# Patient Record
Sex: Male | Born: 1985 | Race: White | Hispanic: No | Marital: Married | Smoking: Never smoker
Health system: Southern US, Community
[De-identification: ages and names within clinical notes are randomized; demographics above are authoritative.]

## PROBLEM LIST (undated history)

## (undated) DIAGNOSIS — F32A Depression, unspecified: Secondary | ICD-10-CM

## (undated) DIAGNOSIS — G43909 Migraine, unspecified, not intractable, without status migrainosus: Secondary | ICD-10-CM

## (undated) DIAGNOSIS — M2550 Pain in unspecified joint: Secondary | ICD-10-CM

## (undated) DIAGNOSIS — G4733 Obstructive sleep apnea (adult) (pediatric): Secondary | ICD-10-CM

## (undated) DIAGNOSIS — R079 Chest pain, unspecified: Secondary | ICD-10-CM

## (undated) DIAGNOSIS — J189 Pneumonia, unspecified organism: Secondary | ICD-10-CM

## (undated) DIAGNOSIS — Z8711 Personal history of peptic ulcer disease: Secondary | ICD-10-CM

## (undated) DIAGNOSIS — G4730 Sleep apnea, unspecified: Secondary | ICD-10-CM

## (undated) DIAGNOSIS — L259 Unspecified contact dermatitis, unspecified cause: Secondary | ICD-10-CM

## (undated) DIAGNOSIS — R0602 Shortness of breath: Secondary | ICD-10-CM

## (undated) DIAGNOSIS — G8929 Other chronic pain: Secondary | ICD-10-CM

## (undated) DIAGNOSIS — K219 Gastro-esophageal reflux disease without esophagitis: Secondary | ICD-10-CM

## (undated) DIAGNOSIS — N289 Disorder of kidney and ureter, unspecified: Secondary | ICD-10-CM

## (undated) DIAGNOSIS — M549 Dorsalgia, unspecified: Secondary | ICD-10-CM

## (undated) DIAGNOSIS — R7303 Prediabetes: Secondary | ICD-10-CM

## (undated) DIAGNOSIS — K589 Irritable bowel syndrome without diarrhea: Secondary | ICD-10-CM

## (undated) DIAGNOSIS — Z87442 Personal history of urinary calculi: Secondary | ICD-10-CM

## (undated) HISTORY — DX: Depression, unspecified: F32.A

## (undated) HISTORY — DX: Irritable bowel syndrome, unspecified: K58.9

## (undated) HISTORY — DX: Personal history of peptic ulcer disease: Z87.11

## (undated) HISTORY — DX: Disorder of kidney and ureter, unspecified: N28.9

## (undated) HISTORY — DX: Unspecified contact dermatitis, unspecified cause: L25.9

## (undated) HISTORY — DX: Shortness of breath: R06.02

## (undated) HISTORY — DX: Other chronic pain: G89.29

## (undated) HISTORY — DX: Sleep apnea, unspecified: G47.30

## (undated) HISTORY — DX: Dorsalgia, unspecified: M54.9

## (undated) HISTORY — DX: Chest pain, unspecified: R07.9

## (undated) HISTORY — DX: Migraine, unspecified, not intractable, without status migrainosus: G43.909

## (undated) HISTORY — DX: Gastro-esophageal reflux disease without esophagitis: K21.9

## (undated) HISTORY — DX: Obstructive sleep apnea (adult) (pediatric): G47.33

## (undated) HISTORY — DX: Pain in unspecified joint: M25.50

---

## 2008-02-24 ENCOUNTER — Emergency Department (HOSPITAL_BASED_OUTPATIENT_CLINIC_OR_DEPARTMENT_OTHER): Admission: EM | Admit: 2008-02-24 | Discharge: 2008-02-24 | Payer: Self-pay | Admitting: Internal Medicine

## 2009-08-27 ENCOUNTER — Ambulatory Visit: Payer: Self-pay | Admitting: Family Medicine

## 2009-08-27 DIAGNOSIS — K219 Gastro-esophageal reflux disease without esophagitis: Secondary | ICD-10-CM | POA: Insufficient documentation

## 2009-08-27 DIAGNOSIS — S61209A Unspecified open wound of unspecified finger without damage to nail, initial encounter: Secondary | ICD-10-CM | POA: Insufficient documentation

## 2009-08-27 DIAGNOSIS — G4700 Insomnia, unspecified: Secondary | ICD-10-CM | POA: Insufficient documentation

## 2009-08-27 DIAGNOSIS — Z7721 Contact with and (suspected) exposure to potentially hazardous body fluids: Secondary | ICD-10-CM | POA: Insufficient documentation

## 2010-02-23 ENCOUNTER — Ambulatory Visit: Payer: Self-pay | Admitting: Family Medicine

## 2010-03-24 ENCOUNTER — Ambulatory Visit
Admission: RE | Admit: 2010-03-24 | Discharge: 2010-03-24 | Payer: Self-pay | Source: Home / Self Care | Attending: Family Medicine | Admitting: Family Medicine

## 2010-03-24 ENCOUNTER — Encounter: Payer: Self-pay | Admitting: Family Medicine

## 2010-03-27 LAB — CONVERTED CEMR LAB: HIV: NONREACTIVE

## 2010-04-14 NOTE — Assessment & Plan Note (Signed)
Summary: NEW PT EST // RS   Vital Signs:  Patient profile:   25 year old male Height:      71.75 inches Weight:      268 pounds BMI:     36.73 Temp:     98.7 degrees F oral Pulse rate:   108 / minute Pulse rhythm:   regular Resp:     12 per minute BP sitting:   108 / 80  (left arm) Cuff size:   regular  Vitals Entered By: Sid Falcon LPN (August 27, 2009 11:19 AM)  Nutrition Counseling: Patient's BMI is greater than 25 and therefore counseled on weight management options. CC: New to establish   History of Present Illness: New patient to establish care. Past medical history reviewed. History of GERD symptoms controlled with diet modification and over-the-counter medication. History of frequent tension headaches. No significant chronic medical problems. No prior surgeries. Takes no medications. No known drug allergies.  Patient has 2 issues to discuss today. 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 6 days ago he was searching someone and reached into their pocket and cut middle finger with a razor blade. No history of known exposure to any body fluids. Patient went to emergency room and no testing done. Went to the nurse with police department and the testing done. Prior history of hepatitis B vaccination.  Other issues intermittent problems with insomnia. Every 3 weeks changes shifts. Difficulty falling asleep. Minimal caffeine use. Tylenol PM without improvement. No regular alcohol use. No signif daytime somnolence.  Preventive Screening-Counseling & Management  Alcohol-Tobacco     Smoking Status: never  Caffeine-Diet-Exercise     Does Patient Exercise: yes  Allergies (verified): No Known Drug Allergies  Past History:  Family History: Last updated: 08/27/2009 Family History of Anxiety Emotional illness, mother Diabetes type ll, father Heart disease, hypertension both grandfathers  Social History: Last updated: 08/27/2009 Occupation:  Engineer, agricultural Married Never Smoked Alcohol use-yes Regular exercise-yes  Risk Factors: Exercise: yes (08/27/2009)  Risk Factors: Smoking Status: never (08/27/2009)  Past Medical History: Chicken pox Frequent headaches GERD HIV testing PMH-FH-SH reviewed for relevance  Family History: Family History of Anxiety Emotional illness, mother Diabetes type ll, father Heart disease, hypertension both grandfathers  Social History: Occupation:  Producer, television/film/video Married Never Smoked Alcohol use-yes Regular exercise-yes Smoking Status:  never Occupation:  employed Does Patient Exercise:  yes  Review of Systems  The patient denies anorexia, fever, weight loss, weight gain, chest pain, syncope, dyspnea on exertion, peripheral edema, prolonged cough, headaches, hemoptysis, abdominal pain, melena, hematochezia, severe indigestion/heartburn, and enlarged lymph nodes.    Physical Exam  General:  Well-developed,well-nourished,in no acute distress; alert,appropriate and cooperative throughout examination Ears:  External ear exam shows no significant lesions or deformities.  Otoscopic examination reveals clear canals, tympanic membranes are intact bilaterally without bulging, retraction, inflammation or discharge. Hearing is grossly normal bilaterally. Mouth:  Oral mucosa and oropharynx without lesions or exudates.  Teeth in good repair. Neck:  No deformities, masses, or tenderness noted. Lungs:  Normal respiratory effort, chest expands symmetrically. Lungs are clear to auscultation, no crackles or wheezes. Heart:  Normal rate and regular rhythm. S1 and S2 normal without gallop, murmur, click, rub or other extra sounds. Extremities:  left middle finger reveals very small superficial laceration which is healing well. No erythema or swelling. Skin:  no rashes.   Cervical Nodes:  No lymphadenopathy noted Psych:  normally interactive, good eye contact, not anxious  appearing, and not  depressed appearing.     Impression & Recommendations:  Problem # 1:  INSOMNIA (ICD-780.52) Assessment New Sleep hygiene discussed. Handout given. Short-term p.r.n. use of Ambien His updated medication list for this problem includes:    Zolpidem Tartrate 10 Mg Tabs (Zolpidem tartrate) ..... One by mouth q hs as needed insomnia  Problem # 2:  LACERATION OF FINGER (ICD-883.0)  suspect very low risk of exposure to body fluids but history of razor blade use unknown. Obtain hepatitis C, HIV, and confirm hepatitis B vaccination.  Will need repeat studies in 6 months. Overall this appears to be fairly low risk type exposure.  Orders: T-HIV Antibody  (Reflex) [id number]) T-Hepatitis C Antibody ([id number]) T-Hepatitis B Surface Antibody ([id number]) Venipuncture (95188)  Problem # 3:  PERSONAL HX CONTACT & EXPOSUR HAZARDOUS BODY FLD (ICD-V15.85)  Orders: T-HIV Antibody  (Reflex) ([id number]) T-Hepatitis C Antibody ([id number]) T-Hepatitis B Surface Antibody ([id number]) Venipuncture (62376)  Problem # 4:  GERD (ICD-530.81)  Complete Medication List: 1)  Zolpidem Tartrate 10 Mg Tabs (Zolpidem tartrate) .... One by mouth q hs as needed insomnia  Patient Instructions: 1)  Please schedule a follow-up appointment in 6 months .  2)  minimize caffeine use. 3)  Minimize alcohol use prior to bedtime Prescriptions: ZOLPIDEM TARTRATE 10 MG TABS (ZOLPIDEM TARTRATE) one by mouth q hs as needed insomnia  #30 x 3   Entered and Authorized by:   Evelena Peat MD   Signed by:   Evelena Peat MD on 08/27/2009   Method used:   Print then Give to Patient   RxID:   [id number]   Preventive Care Screening  Last Tetanus Booster:    Date:  03/15/2006    Results:  Historical

## 2010-04-16 NOTE — Assessment & Plan Note (Signed)
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  Accidental cut to finger 6 months ago with razor blade when reaching in pocket. HIV and Hep C screens neg.  Hep B immune.  Here for f/u labs. No symptoms.  No fever, appetite changes or any other constitutional.  Chronic intermittent insomnia.  Swing shifts with work changing every 3 weeks. Ambien helps, though inconsistently.  Allergies (verified): No Known Drug Allergies  Past History:  Past Medical History: Last updated: 08/27/2009 Chicken pox Frequent headaches GERD HIV testing  Physical Exam  General:  Well-developed,well-nourished,in no acute distress; alert,appropriate and cooperative throughout examination Ears:  External ear exam shows no significant lesions or deformities.  Otoscopic examination reveals clear canals, tympanic membranes are intact bilaterally without bulging, retraction, inflammation or discharge. Hearing is grossly normal bilaterally. Mouth:  Oral mucosa and oropharynx without lesions or exudates.  Teeth in good repair. Neck:  No deformities, masses, or tenderness noted. Lungs:  Normal respiratory effort, chest expands symmetrically. Lungs are clear to auscultation, no crackles or wheezes. Heart:  Normal rate and regular rhythm. S1 and S2 normal without gallop, murmur, click, rub or other extra sounds.   Impression & Recommendations:  Problem # 1:  PERSONAL HX CONTACT & EXPOSUR HAZARDOUS BODY FLD (ICD-V15.85)  Orders: T-Hepatitis C Antibody ([id number]) T-HIV Antibody  (Reflex) ([id number]) Specimen Handling (02725) Venipuncture (36644)  Problem # 2:  INSOMNIA (ICD-780.52)  His updated medication list for this problem includes:  Zolpidem Tartrate 10 Mg Tabs (Zolpidem tartrate) ..... One by mouth q hs as needed insomnia  Complete Medication List: 1)  Zolpidem Tartrate 10 Mg Tabs (Zolpidem tartrate) .... One by mouth q hs as needed insomnia Prescriptions: ZOLPIDEM TARTRATE 10 MG TABS (ZOLPIDEM TARTRATE) one by mouth q hs as needed insomnia  #30 x 3   Entered and Authorized by:   Evelena Peat MD   Signed by:   Evelena Peat MD on 03/24/2010   Method used:   Print then Give to Patient   RxID:   [id number]    Orders Added: 1)  T-Hepatitis C Antibody [[id number]] 2)  T-HIV Antibody  (Reflex) [[id number]] 3)  Specimen Handling [99000] 4)  Venipuncture [09323] 5)  Est. Patient Level III [55732]

## 2010-07-29 ENCOUNTER — Other Ambulatory Visit: Payer: Self-pay | Admitting: *Deleted

## 2010-07-29 MED ORDER — ZOLPIDEM TARTRATE 10 MG PO TABS: 10.0000 mg | ORAL_TABLET | Freq: Every evening | ORAL | Status: DC | PRN

## 2010-07-29 NOTE — Telephone Encounter (Signed)
VM from pt requesting refill of Ambien, sig may take one at [facility] as needed for insomnia Last filled at [facility] 03/24/10 #30 with 3 refills

## 2010-07-29 NOTE — Telephone Encounter (Signed)
Pt informed on VM

## 2010-07-29 NOTE — Telephone Encounter (Signed)
May refill once. 

## 2010-09-22 ENCOUNTER — Other Ambulatory Visit: Payer: Self-pay | Admitting: *Deleted

## 2010-09-22 MED ORDER — ZOLPIDEM TARTRATE 10 MG PO TABS: 10.0000 mg | ORAL_TABLET | Freq: Every evening | ORAL | Status: DC | PRN

## 2010-09-22 NOTE — Telephone Encounter (Signed)
Ok to refill once

## 2010-09-22 NOTE — Telephone Encounter (Signed)
Faxed refill request for Zolpidem 10 mg tab, one at [facility] prn, last filled at [facility] 03-24-10, #30 with 3 refills

## 2012-07-24 ENCOUNTER — Ambulatory Visit (INDEPENDENT_AMBULATORY_CARE_PROVIDER_SITE_OTHER): Payer: Self-pay | Admitting: Family Medicine

## 2012-07-24 ENCOUNTER — Encounter: Payer: Self-pay | Admitting: Family Medicine

## 2012-07-24 VITALS — BP 120/84 | Temp 99.5°F | Wt 291.0 lb

## 2012-07-24 DIAGNOSIS — K219 Gastro-esophageal reflux disease without esophagitis: Secondary | ICD-10-CM

## 2012-07-24 DIAGNOSIS — G43909 Migraine, unspecified, not intractable, without status migrainosus: Secondary | ICD-10-CM | POA: Insufficient documentation

## 2012-07-24 NOTE — Progress Notes (Signed)
  Subjective:    Patient ID: Brian Lam, male    DOB: December 05, 1985, 27 y.o.   MRN: [id number]  HPI Seen for the following issues  GERD symptoms intermittently about every couple weeks.  Symptoms tend to be worse at night when he lies down. Sometimes wakes at night with reflux and substernal burning Sometimes lays down with full stomach.  No regular alcohol use. Drinks about 4-6 cups of coffee per day. No recent appetite or weight change. No melena. No hematemesis. No regular use of nonsteroidals  Patient has some intermittent headaches. He describes 2 different types of headache. He has some diffuse bifrontal headaches which are dull with decreased attention and Tylenol helps He has a different type of headache which is every 3 months which is intense throbbing with photophobia and nausea. These headaches are relieved with sleep. He is concerned about migraines. Mother has had history of migraine headaches. The headaches are sometimes improved with higher doses of nonsteroidals but not fully relieved. They frequently last 24 hours.  No past medical history on file. No past surgical history on file.  reports that he has never smoked. He does not have any smokeless tobacco history on file. His alcohol and drug histories are not on file. family history is not on file. No Known Allergies    Review of Systems  Constitutional: Negative for fatigue.  HENT: Negative for trouble swallowing.   Eyes: Negative for visual disturbance.  Respiratory: Negative for cough, chest tightness and shortness of breath.   Cardiovascular: Negative for chest pain, palpitations and leg swelling.  Gastrointestinal: Negative for nausea, vomiting and abdominal pain.  Neurological: Positive for headaches. Negative for dizziness, syncope, weakness and light-headedness.       Objective:   Physical Exam  Constitutional: He is oriented to person, place, and time. He appears well-developed and well-nourished.   Eyes: Pupils are equal, round, and reactive to light.  Neck: Neck supple. No thyromegaly present.  Cardiovascular: Normal rate and regular rhythm.   Pulmonary/Chest: Effort normal and breath sounds normal. No respiratory distress. He has no wheezes. He has no rales.  Abdominal: Soft. Bowel sounds are normal. He exhibits no distension. There is no tenderness. There is no rebound.  Lymphadenopathy:    He has no cervical adenopathy.  Neurological: He is alert and oriented to person, place, and time. No cranial nerve deficit.  Psychiatric: He has a normal mood and affect. His behavior is normal.          Assessment & Plan:  #1 GERD. Lifestyle modification discussed. Trial of some weight loss. Elevate head of bed 6-8 inches. Avoid eating within 3 hours of bedtime. Try over-the-counter Pepcid, Zantac, or omeprazole and be in touch if this is not helping #2 migraine headaches. Discussed possible triggers. Samples of Relpax 40 mg one at onset of headache and may repeat one in 2 hours as needed. Maximum of 2 in 24 hours. He will call back if these are helping

## 2012-07-24 NOTE — Patient Instructions (Addendum)
Gastroesophageal Reflux Disease, Adult Gastroesophageal reflux disease (GERD) happens when acid from your stomach flows up into the esophagus. When acid comes in contact with the esophagus, the acid causes soreness (inflammation) in the esophagus. Over time, GERD may create small holes (ulcers) in the lining of the esophagus. CAUSES   Increased body weight. This puts pressure on the stomach, making acid rise from the stomach into the esophagus.  Smoking. This increases acid production in the stomach.  Drinking alcohol. This causes decreased pressure in the lower esophageal sphincter (valve or ring of muscle between the esophagus and stomach), allowing acid from the stomach into the esophagus.  Late evening meals and a full stomach. This increases pressure and acid production in the stomach.  A malformed lower esophageal sphincter. Sometimes, no cause is found. SYMPTOMS   Burning pain in the lower part of the mid-chest behind the breastbone and in the mid-stomach area. This may occur twice a week or more often.  Trouble swallowing.  Sore throat.  Dry cough.  Asthma-like symptoms including chest tightness, shortness of breath, or wheezing. DIAGNOSIS  Your caregiver may be able to diagnose GERD based on your symptoms. In some cases, X-rays and other tests may be done to check for complications or to check the condition of your stomach and esophagus. TREATMENT  Your caregiver may recommend over-the-counter or prescription medicines to help decrease acid production. Ask your caregiver before starting or adding any new medicines.  HOME CARE INSTRUCTIONS   Change the factors that you can control. Ask your caregiver for guidance concerning weight loss, quitting smoking, and alcohol consumption.  Avoid foods and drinks that make your symptoms worse, such as:  Caffeine or alcoholic drinks.  Chocolate.  Peppermint or mint flavorings.  Garlic and onions.  Spicy foods.  Citrus fruits,  such as oranges, lemons, or limes.  Tomato-based foods such as sauce, chili, salsa, and pizza.  Fried and fatty foods.  Avoid lying down for the 3 hours prior to your bedtime or prior to taking a nap.  Eat small, frequent meals instead of large meals.  Wear loose-fitting clothing. Do not wear anything tight around your waist that causes pressure on your stomach.  Raise the head of your bed 6 to 8 inches with wood blocks to help you sleep. Extra pillows will not help.  Only take over-the-counter or prescription medicines for pain, discomfort, or fever as directed by your caregiver.  Do not take aspirin, ibuprofen, or other nonsteroidal anti-inflammatory drugs (NSAIDs). SEEK IMMEDIATE MEDICAL CARE IF:   You have pain in your arms, neck, jaw, teeth, or back.  Your pain increases or changes in intensity or duration.  You develop nausea, vomiting, or sweating (diaphoresis).  You develop shortness of breath, or you faint.  Your vomit is green, yellow, black, or looks like coffee grounds or blood.  Your stool is red, bloody, or black. These symptoms could be signs of other problems, such as heart disease, gastric bleeding, or esophageal bleeding. MAKE SURE YOU:   Understand these instructions.  Will watch your condition.  Will get help right away if you are not doing well or get worse. Document Released: 12/09/2004 Document Revised: 05/24/2011 Document Reviewed: 09/18/2010 [facility] Patient Information 2013 [place], [place].  Elevate head of be 6-8 inches Avoid eating within 2-3 hours of bedtime. Migraine Headache A migraine headache is an intense, throbbing pain on one or both sides of your head. A migraine can last for 30 minutes to several hours. CAUSES  The  exact cause of a migraine headache is not always known. However, a migraine may be caused when nerves in the brain become irritated and release chemicals that cause inflammation. This causes pain. SYMPTOMS  Pain on one  or both sides of your head.  Pulsating or throbbing pain.  Severe pain that prevents daily activities.  Pain that is aggravated by any physical activity.  Nausea, vomiting, or both.  Dizziness.  Pain with exposure to bright lights, loud noises, or activity.  General sensitivity to bright lights, loud noises, or smells. Before you get a migraine, you may get warning signs that a migraine is coming (aura). An aura may include:  Seeing flashing lights.  Seeing bright spots, halos, or zig-zag lines.  Having tunnel vision or blurred vision.  Having feelings of numbness or tingling.  Having trouble talking.  Having muscle weakness. MIGRAINE TRIGGERS  Alcohol.  Smoking.  Stress.  Menstruation.  Aged cheeses.  Foods or drinks that contain nitrates, glutamate, aspartame, or tyramine.  Lack of sleep.  Chocolate.  Caffeine.  Hunger.  Physical exertion.  Fatigue.  Medicines used to treat chest pain (nitroglycerine), birth control pills, estrogen, and some blood pressure medicines. DIAGNOSIS  A migraine headache is often diagnosed based on:  Symptoms.  Physical examination.  A CT scan or MRI of your head. TREATMENT Medicines may be given for pain and nausea. Medicines can also be given to help prevent recurrent migraines.  HOME CARE INSTRUCTIONS  Only take over-the-counter or prescription medicines for pain or discomfort as directed by your caregiver. The use of long-term narcotics is not recommended.  Lie down in a dark, quiet room when you have a migraine.  Keep a journal to find out what may trigger your migraine headaches. For example, write down:  What you eat and drink.  How much sleep you get.  Any change to your diet or medicines.  Limit alcohol consumption.  Quit smoking if you smoke.  Get 7 to 9 hours of sleep, or as recommended by your caregiver.  Limit stress.  Keep lights dim if bright lights bother you and make your migraines  worse. SEEK IMMEDIATE MEDICAL CARE IF:   Your migraine becomes severe.  You have a fever.  You have a stiff neck.  You have vision loss.  You have muscular weakness or loss of muscle control.  You start losing your balance or have trouble walking.  You feel faint or pass out.  You have severe symptoms that are different from your first symptoms. MAKE SURE YOU:   Understand these instructions.  Will watch your condition.  Will get help right away if you are not doing well or get worse. Document Released: 03/01/2005 Document Revised: 05/24/2011 Document Reviewed: 02/19/2011 [facility] Patient Information 2013 [place], [place].  Relpax 40 mg at onset of migraine and may repeat in 2 hours as needed (max of 2 in 24 hours)

## 2013-06-25 ENCOUNTER — Ambulatory Visit (INDEPENDENT_AMBULATORY_CARE_PROVIDER_SITE_OTHER): Payer: Managed Care, Other (non HMO) | Admitting: Family Medicine

## 2013-06-25 ENCOUNTER — Encounter: Payer: Self-pay | Admitting: Family Medicine

## 2013-06-25 VITALS — BP 124/72 | HR 110 | Wt 259.0 lb

## 2013-06-25 DIAGNOSIS — M546 Pain in thoracic spine: Secondary | ICD-10-CM

## 2013-06-25 MED ORDER — METHOCARBAMOL 500 MG PO TABS: 500.0000 mg | ORAL_TABLET | Freq: Three times a day (TID) | ORAL | Status: DC | PRN

## 2013-06-25 NOTE — Progress Notes (Signed)
Pre visit review using our clinic review tool, if applicable. No additional management support is needed unless otherwise documented below in the visit note. 

## 2013-06-25 NOTE — Patient Instructions (Signed)
Back Pain, Adult Low back pain is very common. About 1 in 5 people have back pain.The cause of low back pain is rarely dangerous. The pain often gets better over time.About half of people with a sudden onset of back pain feel better in just 2 weeks. About 8 in 10 people feel better by 6 weeks.  CAUSES Some common causes of back pain include:  Strain of the muscles or ligaments supporting the spine.  Wear and tear (degeneration) of the spinal discs.  Arthritis.  Direct injury to the back. DIAGNOSIS Most of the time, the direct cause of low back pain is not known.However, back pain can be treated effectively even when the exact cause of the pain is unknown.Answering your caregiver's questions about your overall health and symptoms is one of the most accurate ways to make sure the cause of your pain is not dangerous. If your caregiver needs more information, he or she may order lab work or imaging tests (X-rays or MRIs).However, even if imaging tests show changes in your back, this usually does not require surgery. HOME CARE INSTRUCTIONS For many people, back pain returns.Since low back pain is rarely dangerous, it is often a condition that people can learn to manageon their own.   Remain active. It is stressful on the back to sit or stand in one place. Do not sit, drive, or stand in one place for more than 30 minutes at a time. Take short walks on level surfaces as soon as pain allows.Try to increase the length of time you walk each day.  Do not stay in bed.Resting more than 1 or 2 days can delay your recovery.  Do not avoid exercise or work.Your body is made to move.It is not dangerous to be active, even though your back may hurt.Your back will likely heal faster if you return to being active before your pain is gone.  Pay attention to your body when you bend and lift. Many people have less discomfortwhen lifting if they bend their knees, keep the load close to their bodies,and  avoid twisting. Often, the most comfortable positions are those that put less stress on your recovering back.  Find a comfortable position to sleep. Use a firm mattress and lie on your side with your knees slightly bent. If you lie on your back, put a pillow under your knees.  Only take over-the-counter or prescription medicines as directed by your caregiver. Over-the-counter medicines to reduce pain and inflammation are often the most helpful.Your caregiver may prescribe muscle relaxant drugs.These medicines help dull your pain so you can more quickly return to your normal activities and healthy exercise.  Put ice on the injured area.  Put ice in a plastic bag.  Place a towel between your skin and the bag.  Leave the ice on for 15-20 minutes, 03-04 times a day for the first 2 to 3 days. After that, ice and heat may be alternated to reduce pain and spasms.  Ask your caregiver about trying back exercises and gentle massage. This may be of some benefit.  Avoid feeling anxious or stressed.Stress increases muscle tension and can worsen back pain.It is important to recognize when you are anxious or stressed and learn ways to manage it.Exercise is a great option. SEEK MEDICAL CARE IF:  You have pain that is not relieved with rest or medicine.  You have pain that does not improve in 1 week.  You have new symptoms.  You are generally not feeling well. SEEK   IMMEDIATE MEDICAL CARE IF:   You have pain that radiates from your back into your legs.  You develop new bowel or bladder control problems.  You have unusual weakness or numbness in your arms or legs.  You develop nausea or vomiting.  You develop abdominal pain.  You feel faint. Document Released: 03/01/2005 Document Revised: 08/31/2011 Document Reviewed: 07/20/2010 [employer] Patient Information 2014 [employer].  

## 2013-06-25 NOTE — Progress Notes (Signed)
Subjective:    Patient ID: Brian Lam, male    DOB: 1985/05/06, 28 y.o.   MRN: [id number]  HPI Comments: Patient is a 28 year-old male who presents complaining of back pain. This began 6 days ago, described as a constant dull pain that can become sharp at times. The pain is accompanied by stiffness in the morning and at night before bed. He has tried NSAIDs and heat with minimal relief. He has had back pain in the past but it has never been this severe nor lasted this long. 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 Within the past 3 months he began exercising but does not remember specific injury that occurred. He has attempted and successfully lost 30 pounds. Patient endorses recent submandibular and submental lympadenopathy but denies dizziness, lightheadedness, chest pain, palpitations, shortness of breath, cough, nausea, vomiting, diarrhea, constipation, dysuria, loss of control of bladder or bowel movements, and saddle anesthesia. .   Back Pain This is a recurrent problem. The current episode started in the past 7 days. The problem has been gradually improving since onset. The pain is present in the thoracic spine. The quality of the pain is described as aching and stabbing. The pain does not radiate. The pain is at a severity of 8/10. The pain is severe. The pain is worse during the night. The symptoms are aggravated by lying down and sitting. Stiffness is present at night and in the morning. Associated symptoms include weight loss. Pertinent negatives include no abdominal pain, bladder incontinence, bowel incontinence, chest pain, fever, headaches, leg pain, numbness, tingling or weakness. He has tried NSAIDs and heat for the symptoms. The treatment provided mild relief.    No past medical history on file.  Review of Systems  Constitutional: Positive for weight loss and activity change. Negative for fever and unexpected weight change.  Eyes:  Negative for visual disturbance.  Respiratory: Negative for cough, chest tightness and shortness of breath.   Cardiovascular: Negative for chest pain and palpitations.  Gastrointestinal: Negative for nausea, vomiting, abdominal pain, diarrhea, constipation, blood in stool and bowel incontinence.  Endocrine: Negative for polyuria.  Genitourinary: Negative for bladder incontinence and difficulty urinating.  Musculoskeletal: Positive for back pain.  Neurological: Negative for dizziness, tingling, weakness, light-headedness, numbness and headaches.       Objective:   Physical Exam  Eyes: Conjunctivae and EOM are normal. Pupils are equal, round, and reactive to light.  Neck: Normal range of motion. No spinous process tenderness present. No mass present.  Musculoskeletal:       Right hip: He exhibits normal range of motion, normal strength and no tenderness.       Left hip: He exhibits normal range of motion, normal strength and no tenderness.       Cervical back: He exhibits normal range of motion, no tenderness, no bony tenderness, no deformity and no pain.       Thoracic back: He exhibits tenderness and pain. He exhibits normal range of motion, no bony tenderness, no swelling, no deformity and no spasm.       Lumbar back: He exhibits normal range of motion, no tenderness, no bony tenderness, no swelling, no deformity and no pain.  Lymphadenopathy:       Head (right side): No submental, no submandibular, no tonsillar, no preauricular and no posterior auricular adenopathy present.       Head (left side): No submental, no submandibular, no tonsillar, no preauricular  and no posterior auricular adenopathy present.    He has no cervical adenopathy.       Right: No supraclavicular adenopathy present.       Left: No supraclavicular adenopathy present.  Neurological: He is alert. He has normal strength. No sensory deficit. Gait normal.  Reflex Scores:      Patellar reflexes are 2+ on the right side  and 2+ on the left side.         Assessment & Plan:  Suspicous for muscular back pain . No imaging or physical therapy due to 6 day duration; advised to return in several weeks if symptoms persist.  Heavy lifitng discouraged. Recommened heat prior to exercise. Prescribed Robaxin 500mg  q 8 hours prn; advised not to take it during the day or work because of potential sedation side effect. Aleve can be used to control pain while working.   GrenadaBrittany Safiyah Cisney, PA-S  Agree with above.  Suspect muscular.  No worrisome red flags.  Consider PT if no better in 2-3 weeks.  Evelena PeatBruce Burchette, MD

## 2014-05-29 ENCOUNTER — Encounter: Payer: Managed Care, Other (non HMO) | Admitting: Family Medicine

## 2014-07-23 ENCOUNTER — Other Ambulatory Visit: Payer: Managed Care, Other (non HMO)

## 2014-07-26 ENCOUNTER — Encounter: Payer: Self-pay | Admitting: Family Medicine

## 2014-07-26 ENCOUNTER — Ambulatory Visit (INDEPENDENT_AMBULATORY_CARE_PROVIDER_SITE_OTHER): Payer: Managed Care, Other (non HMO) | Admitting: Family Medicine

## 2014-07-26 VITALS — BP 110/70 | HR 100 | Temp 98.6°F | Ht 71.0 in | Wt 299.0 lb

## 2014-07-26 DIAGNOSIS — R21 Rash and other nonspecific skin eruption: Secondary | ICD-10-CM

## 2014-07-26 DIAGNOSIS — K219 Gastro-esophageal reflux disease without esophagitis: Secondary | ICD-10-CM

## 2014-07-26 MED ORDER — PANTOPRAZOLE SODIUM 40 MG PO TBEC: 40.0000 mg | DELAYED_RELEASE_TABLET | Freq: Every day | ORAL | Status: DC

## 2014-07-26 MED ORDER — DESOXIMETASONE 0.25 % EX CREA: 1.0000 "application " | TOPICAL_CREAM | Freq: Two times a day (BID) | CUTANEOUS | Status: DC

## 2014-07-26 NOTE — Progress Notes (Signed)
Pre visit review using our clinic review tool, if applicable. No additional management support is needed unless otherwise documented below in the visit note. 

## 2014-07-26 NOTE — Progress Notes (Signed)
   Subjective:    Patient ID: Brian Lam, Brian Lam    DOB: February 29, 1986, 29 y.o.   MRN: [id number]  HPI Patient initially was scheduled for physical. However, he has some acute issues he would rather address instead.  Rash involving right hand. Present for several months. He initially recalls a small cut on his right index finger and subsequently developed some pruritic small vesicles on the sides of his fingers and subsequent scaly rash. This only involves the right hand and mostly lateral aspects of the digits. No pustules  Patient has long history of reflux symptoms. He has recently gained some weight back. He elevate head of bed and tries to avoid eating before bedtime. He has almost daily substernal burning. Exacerbated by certain foods. Still drinks about 4-6 cups of coffee per day. No dysphagia. He has reduced alcohol intake. Nonsmoker.  No past medical history on file. No past surgical history on file.  reports that he has never smoked. He does not have any smokeless tobacco history on file. His alcohol and drug histories are not on file. family history is not on file. No Known Allergies    Review of Systems  Constitutional: Negative for appetite change and unexpected weight change.  HENT: Negative for trouble swallowing.   Respiratory: Negative for shortness of breath.   Cardiovascular: Negative for chest pain.  Gastrointestinal: Negative for nausea, vomiting and diarrhea.  Skin: Positive for rash.       Objective:   Physical Exam  Constitutional: He appears well-developed and well-nourished.  Cardiovascular: Normal rate and regular rhythm.   Pulmonary/Chest: Effort normal and breath sounds normal. No respiratory distress. He has no wheezes. He has no rales.  Abdominal: Soft. Bowel sounds are normal. He exhibits no distension and no mass. There is no tenderness. There is no rebound and no guarding.  Skin: Rash noted.  Patient has erythematous rash involving lateral aspects  of the right first second third and fourth digits. No pustules. He has scaly rash which is nontender          Assessment & Plan:  #1 skin rash involving right hand. Differential is dyshidrotic eczema versus less likely fungal process. KOH obtained. Start Topicort 0.25% cream twice daily and reassess 3 weeks #2 GERD. Lifestyle modification discussed. Lose some weight. Continue to eat small more frequent meals. Reduce caffeine gradually. Start Protonix 40 mg once daily and reassess at follow-up.  May supplement with Pepcid or Zantac

## 2014-07-26 NOTE — Patient Instructions (Signed)

## 2014-07-27 LAB — KOH PREP: RESULT - KOH: NONE SEEN

## 2014-08-16 ENCOUNTER — Encounter: Payer: Self-pay | Admitting: Family Medicine

## 2014-08-16 ENCOUNTER — Ambulatory Visit (INDEPENDENT_AMBULATORY_CARE_PROVIDER_SITE_OTHER): Payer: Managed Care, Other (non HMO) | Admitting: Family Medicine

## 2014-08-16 VITALS — BP 120/80 | HR 110 | Temp 98.2°F | Wt 293.0 lb

## 2014-08-16 DIAGNOSIS — L301 Dyshidrosis [pompholyx]: Secondary | ICD-10-CM

## 2014-08-16 DIAGNOSIS — K219 Gastro-esophageal reflux disease without esophagitis: Secondary | ICD-10-CM

## 2014-08-16 NOTE — Progress Notes (Signed)
Pre visit review using our clinic review tool, if applicable. No additional management support is needed unless otherwise documented below in the visit note. 

## 2014-08-16 NOTE — Patient Instructions (Signed)
Consider taking Protonix daily for at least another month and then consider reduce to every other day for one month and then discontinue if no breakthrough symptoms.

## 2014-08-16 NOTE — Progress Notes (Signed)
   Subjective:    Patient ID: Brian Lam, male    DOB: [birth year], 29 y.o.   MRN: [id number]  HPI  Patient seen for follow-up  Recent GERD. Becoming also daily symptoms for several months. We started Protonix and has had virtual total resolution of symptoms. He noticed improvement after one day of medication. He is also scaled back caffeine use. No recent appetite or weight changes. No dysphagia  Skin rash involving hands. KOH negative for fungus. Improved with Topicort cream. Still has occasional itching  No past medical history on file. No past surgical history on file.  reports that he has never smoked. He does not have any smokeless tobacco history on file. His alcohol and drug histories are not on file. family history is not on file. No Known Allergies   Review of Systems  Constitutional: Negative for appetite change and unexpected weight change.  HENT: Negative for trouble swallowing.   Respiratory: Negative for shortness of breath.   Cardiovascular: Negative for chest pain.       Objective:   Physical Exam  Constitutional: He appears well-developed and well-nourished.  Cardiovascular: Normal rate and regular rhythm.   Pulmonary/Chest: Effort normal and breath sounds normal. No respiratory distress. He has no wheezes. He has no rales.  Skin:  Skin rashes much improved on hands. He still has some mild dryness and minimal erythema but much less scaling          Assessment & Plan:  #1 GERD. Improved. Continue protonix 40 mg daily for another month and then try reducing every other day for a month and if symptoms stable at that point try to discontinue. Continue lifestyle modification and try to lose some weight #2 dyshidrotic eczema. Improved. Do not use Topicort more than 2 weeks continuously. Consider daily moisturizer

## 2014-11-25 ENCOUNTER — Ambulatory Visit (INDEPENDENT_AMBULATORY_CARE_PROVIDER_SITE_OTHER): Payer: Managed Care, Other (non HMO) | Admitting: Family Medicine

## 2014-11-25 ENCOUNTER — Telehealth: Payer: Self-pay | Admitting: Family Medicine

## 2014-11-25 ENCOUNTER — Encounter: Payer: Self-pay | Admitting: Family Medicine

## 2014-11-25 VITALS — BP 126/78 | HR 86 | Temp 98.1°F | Wt 298.0 lb

## 2014-11-25 DIAGNOSIS — R21 Rash and other nonspecific skin eruption: Secondary | ICD-10-CM

## 2014-11-25 DIAGNOSIS — R197 Diarrhea, unspecified: Secondary | ICD-10-CM | POA: Diagnosis not present

## 2014-11-25 NOTE — Telephone Encounter (Signed)
Pt was seen 11/25/14

## 2014-11-25 NOTE — Progress Notes (Signed)
   Subjective:    Patient ID: Brian Lam    DOB: November 02, 1985, 29 y.o.   MRN: [id number]  HPI  Patient is seen for possible "spider bite "right left calf region. He was out in the woods a lot last Thursday. He has a couple small reddish spots that were pruritic on the posterior calf area. No pustules. No fevers or chills. He had some nonspecific malaise of the past couple days. He has had some diffuse abdominal cramps and had some non-bloody mild diarrhea earlier today. No fevers or chills. No recent antibiotics. No recent travels. No recent tick bites. No arthralgias. No other skin rash.  No past medical history on file. No past surgical history on file.  reports that he has never smoked. He does not have any smokeless tobacco history on file. His alcohol and drug histories are not on file. family history is not on file. No Known Allergies   Review of Systems  Constitutional: Positive for fatigue. Negative for fever and chills.  Respiratory: Negative for cough and shortness of breath.   Cardiovascular: Negative for chest pain.  Gastrointestinal: Positive for diarrhea. Negative for nausea, vomiting and abdominal pain.  Genitourinary: Negative for dysuria.  Musculoskeletal: Negative for arthralgias.  Skin: Positive for rash.  Hematological: Negative for adenopathy.       Objective:   Physical Exam  Constitutional: He appears well-developed and well-nourished.  Cardiovascular: Regular rhythm.   Pulmonary/Chest: Effort normal and breath sounds normal. No respiratory distress. He has no wheezes. He has no rales.  Abdominal: Soft. He exhibits no mass. There is no tenderness. There is no rebound and no guarding.  Skin: Rash noted.  He has a couple of small macular to slightly raised erythematous patches on his right posterior calf-1 cm- and smaller left posterior calf. No pustules. Does have slightly punctate center          Assessment & Plan:  #1 skin rash legs. Suspect  some type of bite. No evidence to suggest abscess and no pustules. Suspect mild allergic reaction. Over-the-counter topical steroid and consider an histamine and observe #2 mild diarrhea. Suspect viral process. Appears to be well-hydrated. Symptoms are minimal. Consider Imodium if diarrhea progresses

## 2014-11-25 NOTE — Patient Instructions (Signed)

## 2014-11-25 NOTE — Progress Notes (Signed)
Pre visit review using our clinic review tool, if applicable. No additional management support is needed unless otherwise documented below in the visit note. 

## 2014-11-25 NOTE — Telephone Encounter (Signed)
[facility] - Client TELEPHONE ADVICE RECORD [facility] Patient Name: Brian Lam DOB: 1985-10-03 Initial Comment Caller states had a spider bite on Thursday; now having fatigue and abd pain Nurse Assessment Nurse: Roderic Ovens, RN, Amy Date/Time Lamount Cohen Time): 11/25/2014 9:12:37 AM Confirm and document reason for call. If symptomatic, describe symptoms. ---THURSDAY MORNING SPIDER BITES X 2. HE IS NOT HAVING DISCOMFORT FROM THE BITES, SLIGHTLY ITCHY. THAT EVENING AND AFTERWARDS HE IS HAVING FATIGUE. HE THOUGHT HE WAS BUSY AND NOT HAVING TIME FOR REST. HE IS ALSO HAVING SOME ABD PAIN. LAST NIGHT ABD PAIN AND SOME DIARRHEA AS WELL. HE DOES NOT KNOW WHAT KIND OF SPIDERS THEY WERE. HE DID FEEL PAIN LIKE A BURNING SENSATION AFTER ONE SPIDERS. THE AREAS ARE RED, 1.5 INCH WIDE AND THE OTHER ONE IS VERY SMALL. THERE IS A SMALL MARK IN THE MIDDLE AND SCABBED OVER AND THERE WAS SOME PUS COMING OUT. HOT TO TOUCH, NO RED STREAKS FROM THEM. NO FEVER. Has the patient traveled out of the country within the last 30 days? ---Not Applicable Does the patient require triage? ---Yes Related visit to physician within the last 2 weeks? ---No Does the PT have any chronic conditions? (i.e. diabetes, asthma, etc.) ---Yes List chronic conditions. ---ACID REFLUX Did the patient indicate they were pregnant? ---No INCORRECT INFORMATION PUT IN AT THE BEGINNING OF RECORD. Guidelines Guideline Title Affirmed Question Affirmed Notes Spider Bite - [place] [1] SEVERE bite pain AND [2] not improved after 2 hours of pain medicine Final Disposition User See Physician within 4 Hours (or PCP triage) Roderic Ovens, RN, Amy Referrals REFERRED TO PCP OFFICE Disagree/Comply: Comply

## 2015-05-04 ENCOUNTER — Other Ambulatory Visit: Payer: Self-pay | Admitting: Family Medicine

## 2015-06-01 ENCOUNTER — Other Ambulatory Visit: Payer: Self-pay | Admitting: Family Medicine

## 2015-07-02 ENCOUNTER — Encounter: Payer: Self-pay | Admitting: Family Medicine

## 2015-07-02 ENCOUNTER — Ambulatory Visit (INDEPENDENT_AMBULATORY_CARE_PROVIDER_SITE_OTHER): Payer: Managed Care, Other (non HMO) | Admitting: Family Medicine

## 2015-07-02 VITALS — BP 112/82 | HR 100 | Temp 99.0°F | Ht 71.0 in | Wt 301.3 lb

## 2015-07-02 DIAGNOSIS — K219 Gastro-esophageal reflux disease without esophagitis: Secondary | ICD-10-CM

## 2015-07-02 DIAGNOSIS — R1310 Dysphagia, unspecified: Secondary | ICD-10-CM

## 2015-07-02 NOTE — Patient Instructions (Signed)
Food Choices for Gastroesophageal Reflux Disease, Adult When you have gastroesophageal reflux disease (GERD), the foods you eat and your eating habits are very important. Choosing the right foods can help ease the discomfort of GERD. WHAT GENERAL GUIDELINES DO I NEED TO FOLLOW?  Choose fruits, vegetables, whole grains, low-fat dairy products, and low-fat meat, fish, and poultry.  Limit fats such as oils, salad dressings, butter, nuts, and avocado.  Keep a food diary to identify foods that cause symptoms.  Avoid foods that cause reflux. These may be different for different people.  Eat frequent small meals instead of three large meals each day.  Eat your meals slowly, in a relaxed setting.  Limit fried foods.  Cook foods using methods other than frying.  Avoid drinking alcohol.  Avoid drinking large amounts of liquids with your meals.  Avoid bending over or lying down until 2-3 hours after eating. WHAT FOODS ARE NOT RECOMMENDED? The following are some foods and drinks that may worsen your symptoms: Vegetables Tomatoes. Tomato juice. Tomato and spaghetti sauce. Chili peppers. Onion and garlic. Horseradish. Fruits Oranges, grapefruit, and lemon (fruit and juice). Meats High-fat meats, fish, and poultry. This includes hot dogs, ribs, ham, sausage, salami, and bacon. Dairy Whole milk and chocolate milk. Sour cream. Cream. Butter. Ice cream. Cream cheese.  Beverages Coffee and tea, with or without caffeine. Carbonated beverages or energy drinks. Condiments Hot sauce. Barbecue sauce.  Sweets/Desserts Chocolate and cocoa. Donuts. Peppermint and spearmint. Fats and Oils High-fat foods, including [place] fries and potato chips. Other Vinegar. Strong spices, such as black pepper, white pepper, red pepper, cayenne, curry powder, cloves, ginger, and chili powder. The items listed above may not be a complete list of foods and beverages to avoid. Contact your dietitian for more  information.   This information is not intended to replace advice given to you by your health care provider. Make sure you discuss any questions you have with your health care provider.   Document Released: 03/01/2005 Document Revised: 03/22/2014 Document Reviewed: 01/03/2013 Elsevier Interactive Patient Education 2016 [employer].  Elevate head of bed about 6 inches Avoid eating within 3 hours of bedtime Consider adding OTC Zantac or Pepcid to your Protonix We will set up GI referral.

## 2015-07-02 NOTE — Progress Notes (Signed)
Pre visit review using our clinic review tool, if applicable. No additional management support is needed unless otherwise documented below in the visit note. 

## 2015-07-02 NOTE — Progress Notes (Signed)
   Subjective:    Patient ID: Brian HatchDaniel Lam, male    DOB: 1985/08/19, 30 y.o.   MRN: [id number]  HPI Patient has long history of GERD. He takes Protonix 40 mg daily. Has had more persistent breakthrough GERD symptoms recently with regular use of Protonix. Occasional mild dysphagia.  He has frequent substernal burning that radiates all the way to the sternal notch. Sometimes wakes up with symptoms. Night symptoms are very frequent. Denies any appetite or weight changes. Has recently tried to reduce his caffeine intake. Occasionally supplements with liquid antacid with some relief.  Nonsmoker. Rare alcohol use. Has elevated head of bed about 3 inches. No recent exertional chest pains. Denies any hoarseness or chronic cough  No past medical history on file. No past surgical history on file.  reports that he has never smoked. He does not have any smokeless tobacco history on file. His alcohol and drug histories are not on file. family history is not on file. No Known Allergies    Review of Systems  Constitutional: Negative for appetite change and unexpected weight change.  HENT: Negative for sore throat and voice change.   Respiratory: Negative for cough, shortness of breath and wheezing.   Cardiovascular: Negative for chest pain.  Gastrointestinal: Negative for nausea, vomiting and abdominal pain.  Hematological: Negative for adenopathy.       Objective:   Physical Exam  Constitutional: He appears well-developed and well-nourished.  Cardiovascular: Normal rate and regular rhythm.   Pulmonary/Chest: Effort normal and breath sounds normal. No respiratory distress. He has no wheezes. He has no rales.  Abdominal: Soft. Bowel sounds are normal. He exhibits no distension and no mass. There is no tenderness. There is no rebound and no guarding.  Musculoskeletal: He exhibits no edema.          Assessment & Plan:  Persistent GERD symptoms on PPI. We discussed dietary modification.  Continue gradual caffeine reduction. Discussed other possible dietary factors. Try to lose some weight. Supplement with Zantac or Pepcid. Set up GI referral given worsening of symptoms on PPI with Protonix.  He describes occasional dysphagia but no worrisome appetite or weight changes.

## 2015-07-05 ENCOUNTER — Other Ambulatory Visit: Payer: Self-pay | Admitting: Family Medicine

## 2015-07-21 ENCOUNTER — Ambulatory Visit (INDEPENDENT_AMBULATORY_CARE_PROVIDER_SITE_OTHER): Payer: Managed Care, Other (non HMO) | Admitting: Family Medicine

## 2015-07-21 ENCOUNTER — Ambulatory Visit (INDEPENDENT_AMBULATORY_CARE_PROVIDER_SITE_OTHER)
Admission: RE | Admit: 2015-07-21 | Discharge: 2015-07-21 | Disposition: A | Discharge status: Home / Self Care | Payer: Managed Care, Other (non HMO) | Source: Ambulatory Visit | Attending: Family Medicine | Admitting: Family Medicine

## 2015-07-21 VITALS — BP 118/90 | HR 94 | Temp 97.9°F | Ht 71.0 in | Wt 292.8 lb

## 2015-07-21 DIAGNOSIS — R0781 Pleurodynia: Secondary | ICD-10-CM

## 2015-07-21 NOTE — Progress Notes (Signed)
   Subjective:    Patient ID: Brian Lam, male    DOB: [birth year], 30 y.o.   MRN: [id number]  HPI  Patient seen with left anterior rib cage pain.  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 When he hit the ground he felt a sharp pain left anterior rib cage. Some pain with deep breathing. No visible bruising. He's had some pain left anterior rib cage around 6 to eighth ribs since then.   Last weekend he developed acute fever of 102 also some vomiting and body aches and headache. The symptoms of gradually improved. He has had no fever today whatsoever. No further vomiting. No diarrhea.   GERD symptoms somewhat improved after adding Zantac to his Protonix regimen  No past medical history on file. No past surgical history on file.  reports that he has never smoked. He does not have any smokeless tobacco history on file. His alcohol and drug histories are not on file. family history is not on file. No Known Allergies    Review of Systems  Constitutional: Negative for fever and chills.  Respiratory: Positive for cough. Negative for shortness of breath.   Cardiovascular: Negative for chest pain and palpitations.  Gastrointestinal: Negative for vomiting.       Objective:   Physical Exam  Constitutional: He appears well-developed and well-nourished.  Cardiovascular: Normal rate and regular rhythm.   Pulmonary/Chest: Effort normal and breath sounds normal. No respiratory distress. He has no wheezes. He has no rales. He exhibits no tenderness.  No visible chest wall bruising. He has some tenderness over the left seventh and eighth rib region          Assessment & Plan:   Left anterior rib cage pain following fall. Rule out fracture. We discussed pros and cons of x-rays and he would like to proceed to help define. 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  Brian CoveyBruce W My Madariaga MD [facility]

## 2015-07-21 NOTE — Progress Notes (Signed)
Pre visit review using our clinic review tool, if applicable. No additional management support is needed unless otherwise documented below in the visit note. 

## 2015-08-03 ENCOUNTER — Other Ambulatory Visit: Payer: Self-pay | Admitting: Family Medicine

## 2016-01-26 ENCOUNTER — Other Ambulatory Visit: Payer: Self-pay | Admitting: Family Medicine

## 2016-04-16 ENCOUNTER — Encounter: Payer: Self-pay | Admitting: Family Medicine

## 2016-04-16 ENCOUNTER — Ambulatory Visit (INDEPENDENT_AMBULATORY_CARE_PROVIDER_SITE_OTHER): Payer: Managed Care, Other (non HMO) | Admitting: Family Medicine

## 2016-04-16 VITALS — BP 118/90 | HR 130 | Temp 98.6°F | Ht 71.0 in | Wt 312.0 lb

## 2016-04-16 DIAGNOSIS — L301 Dyshidrosis [pompholyx]: Secondary | ICD-10-CM | POA: Diagnosis not present

## 2016-04-16 MED ORDER — DESOXIMETASONE 0.25 % EX CREA: 1.0000 "application " | TOPICAL_CREAM | Freq: Two times a day (BID) | CUTANEOUS | 2 refills | Status: DC

## 2016-04-16 NOTE — Progress Notes (Signed)
Subjective:     Patient ID: Brian Lam, male   DOB: 11-Feb-1986, 31 y.o.   MRN: [id number]  HPI Patient's seen for recurrent hand rash. He's had similar process in the past and has seen dermatologist. We had previously performed scraping which was negative for KOH. He states he was treated once for a "staph infection "with antibiotics but is not sure if this made much difference. He had scraping per dermatology which grew out some type of staph. Rash tends to recur and start with pruritic vesicles on the fingers that rupture and become more scaly. Moderate pruritus. He's not aware of any contact allergies. He's tried multiple types of moisturizers without much improvement. He has pending follow-up with dermatology at the end of this month.  History reviewed. No pertinent past medical history. History reviewed. No pertinent surgical history.  reports that he has never smoked. He has never used smokeless tobacco. He reports that he does not drink alcohol or use drugs. family history is not on file. No Known Allergies   Review of Systems  Constitutional: Negative for chills and fever.  Skin: Positive for rash.       Objective:   Physical Exam  Constitutional: He appears well-developed and well-nourished.  Cardiovascular: Normal rate and regular rhythm.   Skin: Rash noted.  Patient's has rash involving the right index and left middle finger. He has a few small vesicles but mostly dry scaly rash especially involving the left middle finger. No pustules.       Assessment:     Recurrent scaly skin rash involving the hands (fingers). Doubt contact allergy. Suspect dyshidrotic eczema    Plan:     -Trial of Topicort 0.25% cream twice daily -Keep hands out of water's much as possible -Keep dermatology appointment if symptoms not improving  Kristian CoveyBruce W Chequita Mofield MD [facility]

## 2016-04-16 NOTE — Patient Instructions (Signed)
  Try to keep hands out of water as much as possible May use the steroid cream up to two weeks continuous twice daily.

## 2016-04-16 NOTE — Progress Notes (Signed)
Pre visit review using our clinic review tool, if applicable. No additional management support is needed unless otherwise documented below in the visit note. 

## 2016-05-23 ENCOUNTER — Other Ambulatory Visit: Payer: Self-pay | Admitting: Family Medicine

## 2016-07-16 ENCOUNTER — Encounter: Payer: Self-pay | Admitting: Family Medicine

## 2016-07-19 MED ORDER — ELETRIPTAN HYDROBROMIDE 40 MG PO TABS: 40.0000 mg | ORAL_TABLET | ORAL | 5 refills | Status: DC | PRN

## 2016-08-16 ENCOUNTER — Other Ambulatory Visit: Payer: Self-pay | Admitting: Family Medicine

## 2016-10-04 ENCOUNTER — Encounter: Payer: Self-pay | Admitting: Family Medicine

## 2016-10-05 ENCOUNTER — Ambulatory Visit (INDEPENDENT_AMBULATORY_CARE_PROVIDER_SITE_OTHER): Payer: Managed Care, Other (non HMO) | Admitting: Family Medicine

## 2016-10-05 ENCOUNTER — Encounter: Payer: Self-pay | Admitting: Family Medicine

## 2016-10-05 VITALS — BP 102/78 | HR 97 | Temp 99.1°F | Wt 298.4 lb

## 2016-10-05 DIAGNOSIS — L237 Allergic contact dermatitis due to plants, except food: Secondary | ICD-10-CM

## 2016-10-05 DIAGNOSIS — R202 Paresthesia of skin: Secondary | ICD-10-CM

## 2016-10-05 MED ORDER — PREDNISONE 10 MG PO TABS: ORAL_TABLET | ORAL | 0 refills | Status: DC

## 2016-10-05 NOTE — Patient Instructions (Signed)

## 2016-10-05 NOTE — Progress Notes (Signed)
Subjective:     Patient ID: Brian Lam, male   DOB: 1985-10-19, 31 y.o.   MRN: [id number]  HPI Patient seen with the following issues  Pruritic rash which is widespread involving his trunk and upper and lower extremity. Yard work recently. He thinks this may be contact dermatitis. Has not had an outbreak this severe in the past. He tried topical such as calamine without much improvement. No fevers or chills.  Mild numbness involving left foot second through fourth toes. Onset 2-3 weeks or so ago.  No foot involvement and no leg involvement. No motor weakness. No right-sided involvement. Symptoms are very mild and present for the past couple weeks. No back pain. No change of footwear. No exacerbating factors. No tick bites.  No past medical history on file. No past surgical history on file.  reports that he has never smoked. He has never used smokeless tobacco. He reports that he does not drink alcohol or use drugs. family history is not on file. No Known Allergies   Review of Systems  Constitutional: Negative for chills and fever.  Eyes: Negative for visual disturbance.  Endocrine: Negative for polydipsia and polyuria.  Musculoskeletal: Negative for back pain.  Skin: Positive for rash.  Neurological: Positive for numbness. Negative for weakness.  Hematological: Negative for adenopathy. Does not bruise/bleed easily.       Objective:   Physical Exam  Constitutional: He is oriented to person, place, and time. He appears well-developed and well-nourished.  Cardiovascular: Normal rate and regular rhythm.   Neurological: He is alert and oriented to person, place, and time. No cranial nerve deficit.  Patient has full strength lower extremities and symmetric reflexes knee and ankle bilaterally. He has mild sensory impairment with monofilament left second through fourth toes ventrally but not dorsally  Skin: Rash noted.  Widely scattered rash with reddish base and vesicular surface  with patches on his forearms, arms, legs, and trunk       Assessment:     #1 contact dermatitis with widespread involvement  #2 mild numbness involving left second through fourth toes. No motor involvement    Plan:     -Prednisone taper starting at 60 mg-for contact dermatitis.. Reviewed potential side effects -We discuss potential further evaluation regarding his foot symptoms but since they're mild and only present for couple weeks recommend observation. Recommended further evaluation if he has any progressive numbness or new symptoms such as weakness -check fasting glucose, B12, TSH, SPEP if paresthesias persist.  Kristian CoveyBruce W Windsor Zirkelbach MD [facility]

## 2016-12-02 ENCOUNTER — Encounter: Payer: Self-pay | Admitting: Family Medicine

## 2017-01-21 ENCOUNTER — Encounter: Payer: Self-pay | Admitting: Family Medicine

## 2017-02-11 ENCOUNTER — Other Ambulatory Visit: Payer: Self-pay | Admitting: Family Medicine

## 2017-02-16 ENCOUNTER — Encounter: Payer: Self-pay | Admitting: Family Medicine

## 2017-02-16 DIAGNOSIS — M25561 Pain in right knee: Secondary | ICD-10-CM

## 2017-02-16 DIAGNOSIS — M25562 Pain in left knee: Principal | ICD-10-CM

## 2017-02-22 NOTE — Telephone Encounter (Signed)
Referral placed as directed. 

## 2017-02-24 ENCOUNTER — Ambulatory Visit: Payer: Managed Care, Other (non HMO) | Admitting: Family Medicine

## 2017-02-24 ENCOUNTER — Encounter: Payer: Self-pay | Admitting: Family Medicine

## 2017-02-24 VITALS — BP 100/82 | HR 90 | Temp 98.3°F | Wt 296.0 lb

## 2017-02-24 DIAGNOSIS — R0789 Other chest pain: Secondary | ICD-10-CM | POA: Diagnosis not present

## 2017-02-24 MED ORDER — ALUMINUM-MAGNESIUM-SIMETHICONE 200-200-20 MG/5ML PO SUSP
30.00 mL | ORAL | Status: DC
Start: ? — End: 2017-02-24

## 2017-02-24 NOTE — Progress Notes (Signed)
Subjective:     Patient ID: Brian Lam, male   DOB: 04-27-1985, 31 y.o.   MRN: [id number]  HPI Patient seen for ER follow-up. 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 This occurred when he was sitting down. This was followed by very fleeting sharp substernal chest pain lasting about 10 seconds followed by some achy discomfort in his chest which lasted about one and one half hours. He also complained of a "tingling" sensation both arms. He noticed some difficulty getting a full deep breath. No pleuritic pain. No recent cough. No fevers or chills.  Denies any recent exertional chest pain. He shoveled snow the day before without any difficulty. He had some slight nausea without vomiting. No abdominal pain. Does relate about one week history of 3-4 loose to watery stools per day. No bloody stools. No fevers or chills. He has history of GERD which is controlled with Protonix. Denies any recent GERD symptoms.  Felt better after IVFs in ER.    Patient does relate history of panic attack diagnosed about 15 years ago but states this episode was milder but in some ways slightly similar. He has no cardiac history. Nonsmoker. Maternal grandfather died of CAD at 569. Patient relates he had exercise tolerance test through work about a year ago which was "normal ".   [facility] and had multiple labs including CBC, chemistries, d-dimer, troponins which were all normal. EKG showed increased heart rate of 121 but no acute findings. Chest x-ray no acute findings. Pulse came down prior to discharge. Was discussed with patient to consider outpatient stress test. 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  Patient is not doing any formal exercise but is fairly active with various activities and has never had any exertional  related chest pains. No chest pains since yesterday and no dyspnea  History reviewed. No pertinent past medical history. History reviewed. No pertinent surgical history.  reports that  has never smoked. he has never used smokeless tobacco. He reports that he does not drink alcohol or use drugs. family history is not on file. No Known Allergies   Review of Systems  Constitutional: Negative for chills and fever.  Respiratory: Negative for shortness of breath.   Cardiovascular: Negative for chest pain, palpitations and leg swelling.  Gastrointestinal: Positive for diarrhea. Negative for abdominal pain, blood in stool and vomiting.  Genitourinary: Negative for dysuria.  Musculoskeletal: Negative for back pain.  Neurological: Negative for dizziness and syncope.  Hematological: Negative for adenopathy.       Objective:   Physical Exam  Constitutional: He appears well-developed and well-nourished.  Neck: Neck supple. No thyromegaly present.  Cardiovascular: Normal rate and regular rhythm.  Pulmonary/Chest: Effort normal and breath sounds normal. No respiratory distress. He has no wheezes. He has no rales.  Musculoskeletal: He exhibits no edema.       Assessment:     Patient presents with brief episode of atypical chest pain yesterday followed by some dyspnea at rest. Workup as above in ER unremarkable. Patient did improve following IV fluids. He received some type of GI cocktail but states his symptoms were improved prior to that. Low-risk for CAD. Reported negative exercise tolerance test one year ago.? Component of anxiety/panic attack.  Symptoms were very atypical.    Plan:     -  All ER notes and laboratory results reviewed. -We had a long discussion with patient regarding options including potential repeat stress test but overall risk is very low. Since he had negative stress test year ago has had no recent exertional symptoms (and very atypical symptoms) we agreed on observation at  this time. Follow-up immediately for any recurrent symptoms -Follow-up immediately for any exertional symptoms such as chest pain or dyspnea  Brian CoveyBruce W Roth Ress Brian Lam [facility]

## 2017-02-24 NOTE — Patient Instructions (Signed)
Food Choices to Help Relieve Diarrhea, Adult When you have diarrhea, the foods you eat and your eating habits are very important. Choosing the right foods and drinks can help:  Relieve diarrhea.  Replace lost fluids and nutrients.  Prevent dehydration.  What general guidelines should I follow? Relieving diarrhea  Choose foods with less than 2 g or .07 oz. of fiber per serving.  Limit fats to less than 8 tsp (38 g or 1.34 oz.) a day.  Avoid the following: ? Foods and beverages sweetened with high-fructose corn syrup, honey, or sugar alcohols such as xylitol, sorbitol, and mannitol. ? Foods that contain a lot of fat or sugar. ? Fried, greasy, or spicy foods. ? High-fiber grains, breads, and cereals. ? Raw fruits and vegetables.  Eat foods that are rich in probiotics. These foods include dairy products such as yogurt and fermented milk products. They help increase healthy bacteria in the stomach and intestines (gastrointestinal tract, or GI tract).  If you have lactose intolerance, avoid dairy products. These may make your diarrhea worse.  Take medicine to help stop diarrhea (antidiarrheal medicine) only as told by your health care provider. Replacing nutrients  Eat small meals or snacks every 3-4 hours.  Eat bland foods, such as white rice, toast, or baked potato, until your diarrhea starts to get better. Gradually reintroduce nutrient-rich foods as tolerated or as told by your health care provider. This includes: ? Well-cooked protein foods. ? Peeled, seeded, and soft-cooked fruits and vegetables. ? Low-fat dairy products.  Take vitamin and mineral supplements as told by your health care provider. Preventing dehydration   Start by sipping water or a special solution to prevent dehydration (oral rehydration solution, ORS). Urine that is clear or pale yellow means that you are getting enough fluid.  Try to drink at least 8-10 cups of fluid each day to help replace lost  fluids.  You may add other liquids in addition to water, such as clear juice or decaffeinated sports drinks, as tolerated or as told by your health care provider.  Avoid drinks with caffeine, such as coffee, tea, or soft drinks.  Avoid alcohol. What foods are recommended? The items listed may not be a complete list. Talk with your health care provider about what dietary choices are best for you. Grains White rice. White, French, or pita breads (fresh or toasted), including plain rolls, buns, or bagels. White pasta. Saltine, soda, or graham crackers. Pretzels. Low-fiber cereal. Cooked cereals made with water (such as cornmeal, farina, or cream cereals). Plain muffins. Matzo. Melba toast. Zwieback. Vegetables Potatoes (without the skin). Most well-cooked and canned vegetables without skins or seeds. Tender lettuce. Fruits Apple sauce. Fruits canned in juice. Cooked apricots, cherries, grapefruit, peaches, pears, or plums. Fresh bananas and cantaloupe. Meats and other protein foods Baked or boiled chicken. Eggs. Tofu. Fish. Seafood. Smooth nut butters. Ground or well-cooked tender beef, ham, veal, lamb, pork, or poultry. Dairy Plain yogurt, kefir, and unsweetened liquid yogurt. Lactose-free milk, buttermilk, skim milk, or soy milk. Low-fat or nonfat hard cheese. Beverages Water. Low-calorie sports drinks. Fruit juices without pulp. Strained tomato and vegetable juices. Decaffeinated teas. Sugar-free beverages not sweetened with sugar alcohols. Oral rehydration solutions, if approved by your health care provider. Seasoning and other foods Bouillon, broth, or soups made from recommended foods. What foods are not recommended? The items listed may not be a complete list. Talk with your health care provider about what dietary choices are best for you. Grains Whole grain, whole wheat,   bran, or rye breads, rolls, pastas, and crackers. Wild or brown rice. Whole grain or bran cereals. Barley. Oats and  oatmeal. Corn tortillas or taco shells. Granola. Popcorn. Vegetables Raw vegetables. Fried vegetables. Cabbage, broccoli, Brussels sprouts, artichokes, baked beans, beet greens, corn, kale, legumes, peas, sweet potatoes, and yams. Potato skins. Cooked spinach and cabbage. Fruits Dried fruit, including raisins and dates. Raw fruits. Stewed or dried prunes. Canned fruits with syrup. Meat and other protein foods Fried or fatty meats. Deli meats. Chunky nut butters. Nuts and seeds. Beans and lentils. Bacon. Hot dogs. Sausage. Dairy High-fat cheeses. Whole milk, chocolate milk, and beverages made with milk, such as milk shakes. Half-and-half. Cream. sour cream. Ice cream. Beverages Caffeinated beverages (such as coffee, tea, soda, or energy drinks). Alcoholic beverages. Fruit juices with pulp. Prune juice. Soft drinks sweetened with high-fructose corn syrup or sugar alcohols. High-calorie sports drinks. Fats and oils Butter. Cream sauces. Margarine. Salad oils. Plain salad dressings. Olives. Avocados. Mayonnaise. Sweets and desserts Sweet rolls, doughnuts, and sweet breads. Sugar-free desserts sweetened with sugar alcohols such as xylitol and sorbitol. Seasoning and other foods Honey. Hot sauce. Chili powder. Gravy. Cream-based or milk-based soups. Pancakes and waffles. Summary  When you have diarrhea, the foods you eat and your eating habits are very important.  Make sure you get at least 8-10 cups of fluid each day, or enough to keep your urine clear or pale yellow.  Eat bland foods and gradually reintroduce healthy, nutrient-rich foods as tolerated, or as told by your health care provider.  Avoid high-fiber, fried, greasy, or spicy foods. This information is not intended to replace advice given to you by your health care provider. Make sure you discuss any questions you have with your health care provider. Document Released: 05/22/2003 Document Revised: 02/27/2016 Document Reviewed:  02/27/2016 Elsevier Interactive Patient Education  2017 Elsevier Inc.  

## 2017-03-01 ENCOUNTER — Ambulatory Visit: Payer: Managed Care, Other (non HMO) | Admitting: Sports Medicine

## 2017-03-03 ENCOUNTER — Ambulatory Visit: Payer: Managed Care, Other (non HMO) | Admitting: Sports Medicine

## 2017-03-03 ENCOUNTER — Ambulatory Visit
Admission: RE | Admit: 2017-03-03 | Discharge: 2017-03-03 | Disposition: A | Discharge status: Home / Self Care | Payer: Managed Care, Other (non HMO) | Source: Ambulatory Visit | Attending: Sports Medicine | Admitting: Sports Medicine

## 2017-03-03 VITALS — BP 128/78 | Ht 72.0 in | Wt 295.0 lb

## 2017-03-03 DIAGNOSIS — M2240 Chondromalacia patellae, unspecified knee: Secondary | ICD-10-CM

## 2017-03-03 DIAGNOSIS — G8929 Other chronic pain: Secondary | ICD-10-CM

## 2017-03-03 DIAGNOSIS — M25562 Pain in left knee: Secondary | ICD-10-CM

## 2017-03-03 DIAGNOSIS — M25561 Pain in right knee: Principal | ICD-10-CM

## 2017-03-04 ENCOUNTER — Encounter: Payer: Self-pay | Admitting: Sports Medicine

## 2017-03-04 ENCOUNTER — Other Ambulatory Visit: Payer: Self-pay | Admitting: *Deleted

## 2017-03-04 DIAGNOSIS — M2240 Chondromalacia patellae, unspecified knee: Secondary | ICD-10-CM

## 2017-03-04 NOTE — Progress Notes (Signed)
   Subjective:    Patient ID: [name], male    DOB: 1985-08-26, 31 y.o.   MRN: [id number]  HPI chief complaint: Bilateral knee pain  Very pleasant 31 year old male comes in today complaining of long-standing bilateral knee pain. He'shad symptoms going back to his childhood. Pain has been more noticeable over the past 5-10 years but has really gotten bad over the past 3 months. He denies any recent or remote injury. Rather, he describes a gradual onset of pain that he localizes in the anterior knee around the patella. He had an episode a couple of weeks ago where he almost fell as the right knee gave way as he was getting up from a chair. He denies any swelling. No locking or catching of either knee. He does have some difficulty sleeping at night. He does endorse some popping from time to time. No prior knee surgeries. He takes Advil occasionally which does help. He describes his pain as achy in quality. No radiating pain. No numbness or tingling. No imaging.  Past medical history reviewed Medications reviewed Allergies reviewed    Review of Systems    as above Objective:   Physical Exam  Well-developed, obese. No acute distress. Awake alert and oriented 3. Vital signs reviewed  Examination of each knee shows full range of motion bilaterally. No effusion. No tenderness to palpation along the quadriceps or patellar tendon. Negative patellar grind but he does have tethering of each patella laterally. Bilateral quadriceps weakness, left greater than right. Negative apprehension. Knee is stable to valgus and varus stressing. Negative anterior drawer, negative posterior drawer. No tenderness along medial or lateral joint lines. Negative McMurray's. Slight pain with Thessaly's bilaterally. Neurovascularly intact distally. Walking without a limp.  X-rays of both knees including AP, lateral, and sunrise views show no significant degenerative changes and nothing acute. There is a slight lateral  tilt to each patella on the sunrise view.      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 Follow-up with me in 6 weeks for reevaluation. If symptoms persist, consider further diagnostic imaging of the more symptomatically knee. Patient will call with questions or concerns in the interim.

## 2017-03-28 ENCOUNTER — Ambulatory Visit: Payer: Managed Care, Other (non HMO) | Attending: Sports Medicine | Admitting: Physical Therapy

## 2017-03-28 DIAGNOSIS — M25561 Pain in right knee: Secondary | ICD-10-CM | POA: Insufficient documentation

## 2017-03-28 DIAGNOSIS — G8929 Other chronic pain: Secondary | ICD-10-CM

## 2017-03-28 DIAGNOSIS — R262 Difficulty in walking, not elsewhere classified: Secondary | ICD-10-CM | POA: Insufficient documentation

## 2017-03-28 DIAGNOSIS — M25562 Pain in left knee: Secondary | ICD-10-CM | POA: Insufficient documentation

## 2017-03-29 ENCOUNTER — Encounter: Payer: Self-pay | Admitting: Physical Therapy

## 2017-03-29 NOTE — Therapy (Signed)
[facility] [address] [place], [place], [address] Phone: [phone]   Fax:  [phone]  Physical Therapy Evaluation  Patient Details  Name: Brian Lam MRN: [id number] Date of Birth: [birth year] Referring Provider: Dr Reino Bellis    Encounter Date: 03/28/2017  PT End of Session - 03/29/17 0725    Visit Number  1    Number of Visits  16    Date for PT Re-Evaluation  05/24/17    Authorization Type  [employer] 60 dollar co-pay     PT Start Time  1337 Patient 7 minutes left for appointment.    PT Stop Time  1415    PT Time Calculation (min)  38 min    Activity Tolerance  Patient tolerated treatment well    Behavior During Therapy  [facility] for tasks assessed/performed       History reviewed. No pertinent past medical history.  History reviewed. No pertinent surgical history.  There were no vitals filed for this visit.   Subjective Assessment - 03/28/17 1341    Subjective  Patient has a long histroy of bilateral knee pain but over the past 6 months or so the pain has gotten worse. He feels like it buckles on him at times when he is standing althiugh that dosent happen often.     Limitations  Standing;Walking    How long can you sit comfortably?  Knee stiffens when he sits for too long     How long can you stand comfortably?  < 30 minutes     How long can you walk comfortably?  hurts when running     Diagnostic tests  x-rays: bilateral norma     Patient Stated Goals  to have less pain in his knees    Currently in Pain?  Yes    Pain Score  6     Pain Location  Knee    Pain Orientation  Right    Pain Descriptors / Indicators  Aching    Pain Type  Chronic pain    Aggravating Factors   Knee stiffens when he sits for too long, standing, walking, runjin     Pain Relieving Factors  rest     Effect of Pain on Daily Activities  difficulty perfroming ADL's     Multiple Pain Sites  Yes    Pain Score  6    Pain Location  Knee    Pain Orientation  Right    Pain Descriptors / Indicators  Aching    Pain Type  Chronic pain    Pain Onset  More than a month ago    Pain Frequency  Constant    Aggravating Factors   standing; walking; sitting for too long,     Pain Relieving Factors  rest,          [facility] PT Assessment - 03/29/17 0001      Assessment   Medical Diagnosis  Bilateral knee pain Left > right     Referring Provider  Dr Reino Bellis     Onset Date/Surgical Date  -- 6 months but has been hurting longer then that     Hand Dominance  Right    Next MD Visit  04/14/2017    Prior Therapy  No       Precautions   Precautions  None      Restrictions   Weight Bearing Restrictions  No      Balance Screen   Has the patient fallen in  the past 6 months  No    Has the patient had a decrease in activity level because of a fear of falling?   No    Is the patient reluctant to leave their home because of a fear of falling?   No      Home Environment   Additional Comments  No steps in his house.       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    Leisure  jogging       Cognition   Overall Cognitive Status  Within Functional Limits for tasks assessed    Attention  Focused    Focused Attention  Appears intact    Memory  Appears intact    Awareness  Appears intact    Problem Solving  Appears intact      Observation/Other Assessments   Observations  mild pes planus R > L     Focus on Therapeutic Outcomes (FOTO)   34%      Sensation   Additional Comments  Middle 3 toes on the left side.       Coordination   Gross Motor Movements are Fluid and Coordinated  Yes    Fine Motor Movements are Fluid and Coordinated  Yes      Functional Tests   Functional tests  Single leg stance;Step up;Step down      Step Up   Comments  pain in anterior knee with step down       Single Leg Stance   Comments   instability noted bilateral       ROM / Strength   AROM / PROM / Strength  AROM;PROM;Strength      AROM   Overall AROM Comments  tightness with end range knee flexion in prone       PROM   Overall PROM Comments  no pain with end range knee flexion     PROM Assessment Site  Knee      Strength   Strength Assessment Site  Knee;Hip    Right/Left Hip  Right;Left    Right Hip Flexion  5/5    Right Hip Extension  5/5    Right Hip ABduction  5/5    Left Hip Flexion  5/5    Left Hip Extension  5/5    Left Hip ABduction  5/5    Left Hip ADduction  5/5    Right/Left Knee  Right;Left    Right Knee Flexion  5/5    Right Knee Extension  5/5    Left Knee Flexion  5/5    Left Knee Extension  5/5      Flexibility   Soft Tissue Assessment /Muscle Length  yes    Hamstrings  90/90 limited 30 degrees bilateral     Quadriceps  tightness noted bilateral in prone       Palpation   Patella mobility  limited medial lateral movement of the patella     Palpation comment  tenderness to palpation around the patellar tendon      Ambulation/Gait   Gait Comments  mild bilateral pronation with gait              Objective measurements completed on examination: See above findings.              PT Education - 03/29/17 0724    Education  provided  Yes    Education Details  reviewed HEP; symptom mangement     Person(s) Educated  Patient    Methods  Explanation;Demonstration;Tactile cues;Verbal cues;Handout    Comprehension  Verbal cues required;Verbalized understanding;Returned demonstration;Tactile cues required;Need further instruction       PT Short Term Goals - 03/29/17 0735      PT SHORT TERM GOAL #1   Title  Patient will demsotrate a 30 second single leg stance time bilateral without pain     Time  4    Period  Weeks    Status  New    Target Date  04/26/17      PT SHORT TERM GOAL #2   Title  Patient will report 2/10 pain at worst when standing     Time  4    Period   Weeks    Status  New    Target Date  04/26/17      PT SHORT TERM GOAL #3   Title  Patient will be independent with inital HEP     Time  4    Period  Weeks    Status  New    Target Date  04/26/17        PT Long Term Goals - 03/29/17 0736      PT LONG TERM GOAL #1   Title  Patient will stand for 1/2 hour without increased pain in order to perfrom work tasks     Time  8    Status  New    Target Date  05/24/17      PT LONG TERM GOAL #2   Title  Patient will return to running without increased knee pain    Time  8    Period  Weeks    Status  New    Target Date  05/24/17      PT LONG TERM GOAL #3   Title  Patient will demostrate a 34% limitation     Time  8    Period  Weeks    Status  New    Target Date  05/24/17             Plan - 03/29/17 0726    Clinical Impression Statement  Patient is a 32 year old male with bilateral knee pain. Signs and symptoms are consitent with patellar tendinitis. He has decreased eccentric strength.He has limited medial lateral patellar movement. He would benefit from an exxentric strengthening program as well as leraning how to tape himself.     Clinical Presentation  Evolving    Clinical Presentation due to:  increasing pain     Clinical Decision Making  Low    Rehab Potential  Good    PT Frequency  2x / week    PT Duration  8 weeks    PT Treatment/Interventions  ADLs/Self Care Home Management;Cryotherapy;Electrical Stimulation;Ultrasound;Iontophoresis 4mg /ml Dexamethasone;Gait training;Stair training;Therapeutic activities;Therapeutic exercise;Neuromuscular re-education;Manual techniques;Passive range of motion;Dry needling;Splinting;Taping    PT Next Visit Plan  review mconnell taping; single leg stance; heel raise; cone drill; 4 inch step up; low range LAQ with band;     PT Home Exercise Plan  bridging, SLR; clam shell; hamstring stretch; quad stretch     Consulted and Agree with Plan of Care  Patient       Patient will benefit  from skilled therapeutic intervention in order to improve the following deficits and impairments:  Abnormal gait, Pain, Decreased range of motion, Difficulty walking, Decreased activity tolerance, Decreased  endurance  Visit Diagnosis: Chronic pain of left knee - Plan: PT plan of care cert/re-cert  Chronic pain of right knee - Plan: PT plan of care cert/re-cert  Difficulty in walking, not elsewhere classified - Plan: PT plan of care cert/re-cert     Problem List Patient Active Problem List   Diagnosis Date Noted  . 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 Migraine headache 07/24/2012  . GERD 08/27/2009  . INSOMNIA 08/27/2009  . LACERATION OF FINGER 08/27/2009  . PERSONAL HX CONTACT & EXPOSUR HAZARDOUS BODY FLD 08/27/2009    Dessie Coma PT DPT  03/29/2017, 7:50 AM  [facility] [address] [place], [place], [address] Phone: [phone]   Fax:  [phone]  Name: Brian Lam MRN: [id number] Date of Birth: 04/05/1985

## 2017-04-06 ENCOUNTER — Encounter: Payer: Self-pay | Admitting: Physical Therapy

## 2017-04-06 ENCOUNTER — Ambulatory Visit: Payer: Managed Care, Other (non HMO) | Admitting: Physical Therapy

## 2017-04-06 DIAGNOSIS — M25561 Pain in right knee: Secondary | ICD-10-CM

## 2017-04-06 DIAGNOSIS — R262 Difficulty in walking, not elsewhere classified: Secondary | ICD-10-CM

## 2017-04-06 DIAGNOSIS — G8929 Other chronic pain: Secondary | ICD-10-CM

## 2017-04-06 DIAGNOSIS — M25562 Pain in left knee: Secondary | ICD-10-CM | POA: Diagnosis not present

## 2017-04-07 NOTE — Therapy (Addendum)
[place], [place], [address] Phone: [phone]   Fax:  [phone]  Physical Therapy Treatment  Patient Details  Name: Brian Lam MRN: [id number] Date of Birth: [birth year] Referring Provider: Dr Lilia Argue    Encounter Date: 04/06/2017  PT End of Session - 04/07/17 1317    Visit Number  2    Number of Visits  16    Date for PT Re-Evaluation  05/24/17    Authorization Type  [employer] 60 dollar co-pay     PT Start Time  1333    PT Stop Time  1414    PT Time Calculation (min)  41 min    Activity Tolerance  Patient tolerated treatment well    Behavior During Therapy  [facility] for tasks assessed/performed       History reviewed. No pertinent past medical history.  History reviewed. No pertinent surgical history.  There were no vitals filed for this visit.  Subjective Assessment - 04/06/17 1337    Subjective  Patient reports his knees have been better since starting the stretches. He stood last night for a long period of time and had some pain.     How long can you sit comfortably?  Knee stiffens when he sits for too long     How long can you stand comfortably?  < 30 minutes     How long can you walk comfortably?  hurts when running     Diagnostic tests  x-rays: bilateral norma     Patient Stated Goals  to have less pain in his knees    Currently in Pain?  No/denies    Pain Onset  More than a month ago    Pain Frequency  Constant    Pain Relieving Factors  rest                       [facility] Adult PT Treatment/Exercise - 04/07/17 0001      Knee/Hip Exercises: Stretches   Active Hamstring Stretch Limitations  3x20 sec hold bilateral     Quad Stretch Limitations  thomas stretch 3x20 sec hold       Knee/Hip Exercises: Standing   Other Standing Knee Exercises  cone drill to sink x10 each leg; eccentric step down 2x10; eccentric heel raise 2x10;       Knee/Hip Exercises: Supine   Other  Supine Knee/Hip Exercises  eccentric leg press 2x10 yellow       Manual Therapy   Manual Therapy  Taping    McConnell  reviewed tapin and let the patient tape himself. Mod cuing for technique required.              PT Education - 04/06/17 1350    Education provided  Yes    Education Details  HEP; symptom management     Person(s) Educated  Patient    Methods  Explanation;Demonstration;Tactile cues;Verbal cues    Comprehension  Verbalized understanding;Returned demonstration;Verbal cues required;Tactile cues required;Need further instruction       PT Short Term Goals - 04/07/17 1318      PT SHORT TERM GOAL #1   Title  Patient will demsotrate a 30 second single leg stance time bilateral without pain     Time  4    Status  On-going      PT SHORT TERM GOAL #2   Title  Patient will report 2/10 pain at worst when standing     Time  4    Period  Weeks    Status  On-going      PT SHORT TERM GOAL #3   Title  Patient will be independent with inital HEP     Time  4    Period  Weeks    Status  On-going        PT Long Term Goals - 03/29/17 0736      PT LONG TERM GOAL #1   Title  Patient will stand for 1/2 hour without increased pain in order to perfrom work tasks     Time  8    Status  New    Target Date  05/24/17      PT LONG TERM GOAL #2   Title  Patient will return to running without increased knee pain    Time  8    Period  Weeks    Status  New    Target Date  05/24/17      PT LONG TERM GOAL #3   Title  Patient will demostrate a 34% limitation     Time  8    Period  Weeks    Status  New    Target Date  05/24/17            Plan - 04/07/17 1317    Clinical Impression Statement  Good tolerance to eccentric strengthening. Patient advised to follow his symptoms. he has done well with the other exercises. he was advised if he gets sore to conetinue with exercises at home.     Clinical Presentation  Evolving    Clinical Decision Making  Low    Rehab  Potential  Good    PT Frequency  2x / week    PT Duration  8 weeks    PT Treatment/Interventions  ADLs/Self Care Home Management;Cryotherapy;Electrical Stimulation;Ultrasound;Iontophoresis 95m/ml Dexamethasone;Gait training;Stair training;Therapeutic activities;Therapeutic exercise;Neuromuscular re-education;Manual techniques;Passive range of motion;Dry needling;Splinting;Taping    PT Next Visit Plan  review mconnell taping; single leg stance; heel raise; cone drill; 4 inch step up; low range LAQ with band;     PT Home Exercise Plan  bridging, SLR; clam shell; hamstring stretch; quad stretch     Consulted and Agree with Plan of Care  Patient       Patient will benefit from skilled therapeutic intervention in order to improve the following deficits and impairments:  Abnormal gait, Pain, Decreased range of motion, Difficulty walking, Decreased activity tolerance, Decreased endurance  Visit Diagnosis: Chronic pain of left knee  Chronic pain of right knee  Difficulty in walking, not elsewhere classified  PHYSICAL THERAPY DISCHARGE SUMMARY  Visits from Start of Care: 2  Current functional level related to goals / functional outcomes: Improved pain with standing and walking    Remaining deficits: Unknown    Education / Equipment: HEP   Plan: Patient agrees to discharge.  Patient goals were not met. Patient is being discharged due to meeting the stated rehab goals.  ?????       Problem List Patient Active Problem List   Diagnosis Date Noted  . 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 Migraine headache 07/24/2012  . GERD 08/27/2009  . INSOMNIA 08/27/2009  . LACERATION OF FINGER 08/27/2009  . PERSONAL HX CONTACT & EXPOSUR HAZARDOUS BODY FLD 08/27/2009    DCarney LivingPT DPT  04/07/2017, 1:19 PM  [facility] [place] [address]Phone: [phone]  Fax:  [phone] Name: Brian RosevearMRN:  [id number] Date of Birth: 1985/04/15

## 2017-04-14 ENCOUNTER — Ambulatory Visit: Payer: Managed Care, Other (non HMO) | Admitting: Sports Medicine

## 2017-04-21 ENCOUNTER — Ambulatory Visit: Payer: Managed Care, Other (non HMO) | Admitting: Physical Therapy

## 2017-09-29 ENCOUNTER — Encounter: Payer: Self-pay | Admitting: Family Medicine

## 2017-09-30 MED ORDER — SCOPOLAMINE 1 MG/3DAYS TD PT72: MEDICATED_PATCH | TRANSDERMAL | 0 refills | Status: DC

## 2017-10-12 ENCOUNTER — Other Ambulatory Visit: Payer: Self-pay | Admitting: Family Medicine

## 2017-10-12 NOTE — Telephone Encounter (Signed)
OK to send in rx as requested. 

## 2017-10-12 NOTE — Telephone Encounter (Signed)
Copied from CRM [phone]#[id number]. Topic: Quick Communication - See Telephone Encounter >> Oct 12, 2017  4:16 PM Herby AbrahamJohnson, Shiquita C wrote: CRM for notification. See Telephone encounter for: 10/12/17.  Pt's spouse called. Pt is going on a cruise and received scopolamine (TRANSDERM-SCOP, 1.5 MG,) 1 MG/3DAYS, pt forgot the patches at home and would like to know if provider could send in another Rx   Pharmacy: [facility] [address], [place], [phone]   CB: [phone]

## 2017-10-13 MED ORDER — SCOPOLAMINE 1 MG/3DAYS TD PT72: MEDICATED_PATCH | TRANSDERMAL | 0 refills | Status: DC

## 2017-10-13 NOTE — Telephone Encounter (Signed)
Rx done. 

## 2017-10-18 ENCOUNTER — Ambulatory Visit: Payer: Self-pay | Admitting: Family Medicine

## 2017-10-18 NOTE — Telephone Encounter (Signed)
Pt c/o lightheadedness and dizziness. Pt went on a cruise from August 1 to August 5th and felt lightheaded and dizziness during the cruise. Pt called due to persistent symptoms. Pt stated he feels like the room is spinning or tilting. Dizziness is moderate in intensity. Pt doesn't feel like he is going to faint but can stand and walk. Pt has symptoms standing and sitting. Pt's heart rate was 92 and was making lunch for his family at the time of the call. Pt thinks it is persistent motion sickness. Pt stated he has had diarrhea since returning from the cruise. Denies nausea or vomiting. Pt is tolerating water and denies signs of dehydration such as a dry mouth.  Pt stated he  has had dizziness before but does not remember the last time and stated that it went away on it's own.  Care advice given per protocol. Pt verbalized understanding. Appt offered and accepted for tomorrow at 5:15 pm with Dr. Caryl NeverBurchette.   Reason for Disposition . [1] MODERATE dizziness (e.g., interferes with normal activities) AND [2] has NOT been evaluated by physician for this  (Exception: dizziness caused by heat exposure, sudden standing, or poor fluid intake)  Answer Assessment - Initial Assessment Questions 1. DESCRIPTION: "Describe your dizziness."     Like the room is moving- has to hold onto the wall hard to stand but makes him more lightheaded 2. LIGHTHEADED: "Do you feel lightheaded?" (e.g., somewhat faint, woozy, weak upon standing)     yes 3. VERTIGO: "Do you feel like either you or the room is spinning or tilting?" (i.e. vertigo)     yes 4. SEVERITY: "How bad is it?"  "Do you feel like you are going to faint?" "Can you stand and walk?"   - MILD - walking normally   - MODERATE - interferes with normal activities (e.g., work, school)    - SEVERE - unable to stand, requires support to walk, feels like passing out now.      Doesn't feel like he is going to faint but can stand and walk moderate 5. ONSET:  "When did the  dizziness begin?"     August 1 st 6. AGGRAVATING FACTORS: "Does anything make it worse?" (e.g., standing, change in head position)     Standing, has sx sitting 7. HEART RATE: "Can you tell me your heart rate?" "How many beats in 15 seconds?"  (Note: not all patients can do this)       23= 92 is heart rate symptoms started during the cruise but has persisted since coming back from cruise yesterday 8. CAUSE: "What do you think is causing the dizziness?"     Pt is not sure- motion sickness 9. RECURRENT SYMPTOM: "Have you had dizziness before?" If so, ask: "When was the last time?" "What happened that time?"     Yes-does not remember the last time- went away on its own 10. OTHER SYMPTOMS: "Do you have any other symptoms?" (e.g., fever, chest pain, vomiting, diarrhea, bleeding)       Diarrhea since yesterday 11. PREGNANCY: "Is there any chance you are pregnant?" "When was your last menstrual period?"       n/a  Protocols used: DIZZINESS [facility]- LIGHTHEADEDNESS-A-AH

## 2017-10-19 ENCOUNTER — Encounter: Payer: Self-pay | Admitting: Family Medicine

## 2017-10-19 ENCOUNTER — Ambulatory Visit: Payer: Managed Care, Other (non HMO) | Admitting: Family Medicine

## 2017-10-19 VITALS — BP 110/80 | HR 100 | Temp 98.5°F | Wt 292.2 lb

## 2017-10-19 DIAGNOSIS — R197 Diarrhea, unspecified: Secondary | ICD-10-CM | POA: Diagnosis not present

## 2017-10-19 DIAGNOSIS — R42 Dizziness and giddiness: Secondary | ICD-10-CM

## 2017-10-19 MED ORDER — CIPROFLOXACIN HCL 500 MG PO TABS: 500.0000 mg | ORAL_TABLET | Freq: Two times a day (BID) | ORAL | 0 refills | Status: DC

## 2017-10-19 NOTE — Progress Notes (Signed)
  Subjective:     Patient ID: Brian Lam, male   DOB: 1986-03-03, 32 y.o.   MRN: [id number]  HPI Patient seen with symptoms of diarrhea and some mild dizziness with onset on recent cruise. He and his [place] just returned on Monday from [place] The had a four-day trip. He took some scopolamine patches on the cruise but even with scopolamine developed some motion sickness on the boat. His wife had similar symptoms but her symptoms fully cleared promptly after they got back on land. Patient's dizziness has improved but he still some mild motion sickness type symptoms. No syncope.  No vomiting. No visual changes, ataxia, speech change, or focal weakness.  He had some initial nausea but that has now resolved. He has had about 3-4 loose to watery nonbloody stools per day. Onset on day 2 of their cruise.  No abdominal pain. Keeping down fluids well. He only recalls eating off the ship once when they went to a restaurant.  No recent antibiotic use.  No past medical history on file. No past surgical history on file.  reports that he has never smoked. He has never used smokeless tobacco. He reports that he does not drink alcohol or use drugs. family history is not on file. No Known Allergies   Review of Systems  Constitutional: Negative for chills and fever.  Eyes: Negative for visual disturbance.  Respiratory: Negative for shortness of breath.   Cardiovascular: Negative for chest pain.  Gastrointestinal: Positive for diarrhea. Negative for abdominal pain, nausea and vomiting.  Neurological: Positive for dizziness and light-headedness. Negative for syncope, speech difficulty and weakness.       Objective:   Physical Exam  Constitutional: He is oriented to person, place, and time. He appears well-developed and well-nourished.  Eyes: Pupils are equal, round, and reactive to light.  Neck: Neck supple.  Cardiovascular: Normal rate and regular rhythm.  Pulmonary/Chest: Effort normal and breath  sounds normal.  Musculoskeletal: He exhibits no edema.  Lymphadenopathy:    He has no cervical adenopathy.  Neurological: He is alert and oriented to person, place, and time. He displays normal reflexes. No cranial nerve deficit. Coordination normal.  Cervical or normal finger-nose testing. Gait normal. No focal strength deficits.       Assessment:     #1 dizziness. Patient describes some mild vertigo symptoms which persisted after getting off of recent cruise ship. Nonfocal exam neurologically. Symptoms do appear to be improving.   #2 diarrhea following recent travel.  Non-toxic.   He does not appear dehydrated    Plan:     -Consider Cipro 500 mg twice a day for 3 days -Handout given on appropriate diet for diarrhea -Follow-up promptly for any fever, bloody stools, or worsening diarrhea. Also touch base within 3-4 days if diarrhea not resolving -Be in touch if lightheadedness not improving over the next few days  Kristian CoveyBruce W Rhanda Lemire MD [facility]

## 2017-10-19 NOTE — Patient Instructions (Signed)
Food Choices to Help Relieve Diarrhea, Adult  When you have diarrhea, the foods you eat and your eating habits are very important. Choosing the right foods and drinks can help:  · Relieve diarrhea.  · Replace lost fluids and nutrients.  · Prevent dehydration.    What general guidelines should I follow?  Relieving diarrhea  · Choose foods with less than 2 g or .07 oz. of fiber per serving.  · Limit fats to less than 8 tsp (38 g or 1.34 oz.) a day.  · Avoid the following:  ? Foods and beverages sweetened with high-fructose corn syrup, honey, or sugar alcohols such as xylitol, sorbitol, and mannitol.  ? Foods that contain a lot of fat or sugar.  ? Fried, greasy, or spicy foods.  ? High-fiber grains, breads, and cereals.  ? Raw fruits and vegetables.  · Eat foods that are rich in probiotics. These foods include dairy products such as yogurt and fermented milk products. They help increase healthy bacteria in the stomach and intestines (gastrointestinal tract, or GI tract).  · If you have lactose intolerance, avoid dairy products. These may make your diarrhea worse.  · Take medicine to help stop diarrhea (antidiarrheal medicine) only as told by your health care provider.  Replacing nutrients  · Eat small meals or snacks every 3–4 hours.  · Eat bland foods, such as white rice, toast, or baked potato, until your diarrhea starts to get better. Gradually reintroduce nutrient-rich foods as tolerated or as told by your health care provider. This includes:  ? Well-cooked protein foods.  ? Peeled, seeded, and soft-cooked fruits and vegetables.  ? Low-fat dairy products.  · Take vitamin and mineral supplements as told by your health care provider.  Preventing dehydration    · Start by sipping water or a special solution to prevent dehydration (oral rehydration solution, ORS). Urine that is clear or pale yellow means that you are getting enough fluid.  · Try to drink at least 8–10 cups of fluid each day to help replace lost  fluids.  · You may add other liquids in addition to water, such as clear juice or decaffeinated sports drinks, as tolerated or as told by your health care provider.  · Avoid drinks with caffeine, such as coffee, tea, or soft drinks.  · Avoid alcohol.  What foods are recommended?  The items listed may not be a complete list. Talk with your health care provider about what dietary choices are best for you.  Grains  White rice. White, French, or pita breads (fresh or toasted), including plain rolls, buns, or bagels. White pasta. Saltine, soda, or graham crackers. Pretzels. Low-fiber cereal. Cooked cereals made with water (such as cornmeal, farina, or cream cereals). Plain muffins. Matzo. Melba toast. Zwieback.  Vegetables  Potatoes (without the skin). Most well-cooked and canned vegetables without skins or seeds. Tender lettuce.  Fruits  Apple sauce. Fruits canned in juice. Cooked apricots, cherries, grapefruit, peaches, pears, or plums. Fresh bananas and cantaloupe.  Meats and other protein foods  Baked or boiled chicken. Eggs. Tofu. Fish. Seafood. Smooth nut butters. Ground or well-cooked tender beef, ham, veal, lamb, pork, or poultry.  Dairy  Plain yogurt, kefir, and unsweetened liquid yogurt. Lactose-free milk, buttermilk, skim milk, or soy milk. Low-fat or nonfat hard cheese.  Beverages  Water. Low-calorie sports drinks. Fruit juices without pulp. Strained tomato and vegetable juices. Decaffeinated teas. Sugar-free beverages not sweetened with sugar alcohols. Oral rehydration solutions, if approved by your health care   provider.  Seasoning and other foods  Bouillon, broth, or soups made from recommended foods.  What foods are not recommended?  The items listed may not be a complete list. Talk with your health care provider about what dietary choices are best for you.  Grains  Whole grain, whole wheat, bran, or rye breads, rolls, pastas, and crackers. Wild or brown rice. Whole grain or bran cereals. Barley. Oats  and oatmeal. Corn tortillas or taco shells. Granola. Popcorn.  Vegetables  Raw vegetables. Fried vegetables. Cabbage, broccoli, Brussels sprouts, artichokes, baked beans, beet greens, corn, kale, legumes, peas, sweet potatoes, and yams. Potato skins. Cooked spinach and cabbage.  Fruits  Dried fruit, including raisins and dates. Raw fruits. Stewed or dried prunes. Canned fruits with syrup.  Meat and other protein foods  Fried or fatty meats. Deli meats. Chunky nut butters. Nuts and seeds. Beans and lentils. Bacon. Hot dogs. Sausage.  Dairy  High-fat cheeses. Whole milk, chocolate milk, and beverages made with milk, such as milk shakes. Half-and-half. Cream. sour cream. Ice cream.  Beverages  Caffeinated beverages (such as coffee, tea, soda, or energy drinks). Alcoholic beverages. Fruit juices with pulp. Prune juice. Soft drinks sweetened with high-fructose corn syrup or sugar alcohols. High-calorie sports drinks.  Fats and oils  Butter. Cream sauces. Margarine. Salad oils. Plain salad dressings. Olives. Avocados. Mayonnaise.  Sweets and desserts  Sweet rolls, doughnuts, and sweet breads. Sugar-free desserts sweetened with sugar alcohols such as xylitol and sorbitol.  Seasoning and other foods  Honey. Hot sauce. Chili powder. Gravy. Cream-based or milk-based soups. Pancakes and waffles.  Summary  · When you have diarrhea, the foods you eat and your eating habits are very important.  · Make sure you get at least 8–10 cups of fluid each day, or enough to keep your urine clear or pale yellow.  · Eat bland foods and gradually reintroduce healthy, nutrient-rich foods as tolerated, or as told by your health care provider.  · Avoid high-fiber, fried, greasy, or spicy foods.  This information is not intended to replace advice given to you by your health care provider. Make sure you discuss any questions you have with your health care provider.  Document Released: 05/22/2003 Document Revised: 02/27/2016 Document  Reviewed: 02/27/2016  Elsevier Interactive Patient Education © 2018 [employer]

## 2018-03-15 HISTORY — PX: VASECTOMY: SHX75

## 2018-04-03 ENCOUNTER — Encounter: Payer: Self-pay | Admitting: Family Medicine

## 2018-04-04 ENCOUNTER — Other Ambulatory Visit: Payer: Self-pay

## 2018-04-04 MED ORDER — ELETRIPTAN HYDROBROMIDE 40 MG PO TABS: 40.0000 mg | ORAL_TABLET | ORAL | 5 refills | Status: DC | PRN

## 2018-06-26 ENCOUNTER — Encounter: Payer: Self-pay | Admitting: Family Medicine

## 2018-06-27 ENCOUNTER — Other Ambulatory Visit: Payer: Self-pay

## 2018-06-27 MED ORDER — ELETRIPTAN HYDROBROMIDE 40 MG PO TABS: 40.0000 mg | ORAL_TABLET | ORAL | 2 refills | Status: DC | PRN

## 2018-07-19 ENCOUNTER — Other Ambulatory Visit: Payer: Self-pay

## 2018-07-19 ENCOUNTER — Encounter: Payer: Self-pay | Admitting: Family Medicine

## 2018-07-19 DIAGNOSIS — Z3009 Encounter for other general counseling and advice on contraception: Secondary | ICD-10-CM

## 2018-07-19 DIAGNOSIS — M25511 Pain in right shoulder: Secondary | ICD-10-CM

## 2018-07-24 ENCOUNTER — Encounter: Payer: Self-pay | Admitting: Family Medicine

## 2018-07-25 ENCOUNTER — Telehealth: Payer: Self-pay

## 2018-07-25 ENCOUNTER — Ambulatory Visit (INDEPENDENT_AMBULATORY_CARE_PROVIDER_SITE_OTHER): Payer: Managed Care, Other (non HMO) | Admitting: Family Medicine

## 2018-07-25 ENCOUNTER — Other Ambulatory Visit: Payer: Self-pay

## 2018-07-25 DIAGNOSIS — G43109 Migraine with aura, not intractable, without status migrainosus: Secondary | ICD-10-CM | POA: Diagnosis not present

## 2018-07-25 MED ORDER — RIZATRIPTAN BENZOATE 10 MG PO TBDP: 10.0000 mg | ORAL_TABLET | ORAL | 5 refills | Status: DC | PRN

## 2018-07-25 MED ORDER — PROPRANOLOL HCL ER 80 MG PO CP24: 80.0000 mg | ORAL_CAPSULE | Freq: Every day | ORAL | 1 refills | Status: DC

## 2018-07-25 NOTE — Telephone Encounter (Signed)
Called patient to schedule a Doxy telephone appointment with him to go over migraine changes with Dr. Caryl Never.  OK for [facility] to discuss/advise/schedule patient for migraine follow up.  CRM Created.

## 2018-07-25 NOTE — Progress Notes (Signed)
Patient ID: Brian HatchDaniel Lam, male   DOB: 11-12-1985, 33 y.o.   MRN: [id number]  This visit type was conducted due to national recommendations for restrictions regarding the COVID-19 pandemic in an effort to limit this patient's exposure and mitigate transmission in our community.   Virtual Visit via Video Note  I connected with Brian Lam on 07/25/18 at  3:30 PM EDT by a video enabled telemedicine application and verified that I am speaking with the correct person using two identifiers.  Location patient: home Location provider:work or home office Persons participating in the virtual visit: patient, provider  I discussed the limitations of evaluation and management by telemedicine and the availability of in person appointments. The patient expressed understanding and agreed to proceed.   HPI:  Brian Lam has history of migraine headaches.  These are associated frequently with blurred vision and visual aura.  He has pain usually behind the right eye.  This is a searing and throbbing pain and has been associated with nausea and occasional vomiting.  He thinks that stress may be a trigger.  He is not sure of any dietary triggers.  Starting to have these more frequently and sometimes more severe.  He is taken Relpax but this seems to be losing its effect recently.  He specifically would like to consider preventative options.  No recent change in character of migraine.  ROS: See pertinent positives and negatives per HPI.  No past medical history on file.  No past surgical history on file.  No family history on file.  SOCIAL HX: Brian Lam.  Non-smoker.  Works as a Emergency planning/management officerpolice officer.   Current Outpatient Medications:  .  ciprofloxacin (CIPRO) 500 MG tablet, Take 1 tablet (500 mg total) by mouth 2 (two) times daily., Disp: 6 tablet, Rfl: 0 .  desoximetasone (TOPICORT) 0.25 % cream, Apply 1 application topically 2 (two) times daily., Disp: 30 g, Rfl: 2 .  pantoprazole (PROTONIX) 40 MG tablet, TAKE 1  TABLET(40 MG) BY MOUTH DAILY, Disp: 90 tablet, Rfl: 1  EXAM:  VITALS per patient if applicable:  GENERAL: alert, oriented, appears well and in no acute distress  HEENT: atraumatic, conjunttiva clear, no obvious abnormalities on inspection of external nose and ears  NECK: normal movements of the head and neck  LUNGS: on inspection no signs of respiratory distress, breathing rate appears normal, no obvious gross SOB, gasping or wheezing  CV: no obvious cyanosis  MS: moves all visible extremities without noticeable abnormality  PSYCH/NEURO: pleasant and cooperative, no obvious depression or anxiety, speech and thought processing grossly intact  ASSESSMENT AND PLAN:  Discussed the following assessment and plan:  Migraine with aura and without status migrainosus, not intractable-increasing frequency and severity in recent months  -As he is poorly responding to Relpax will have him discontinue Relpax and try Maxalt MLT 10 mg for acute episodes.  He knows to take 1 at onset and may repeat 1 in 2 hours but no more than 2 in 24 hours  -Discussed potential migraine triggers.  He will try to keep records to see if he can find any correlations  -Discussed prophylactic options.  We decided to try Inderal LA 80 mg once daily.  Touch base for follow-up in 3 to 4 weeks.  Consider further titration at that point if not seeing good results     I discussed the assessment and treatment plan with the patient. The patient was provided an opportunity to ask questions and all were answered. The patient agreed with the plan  and demonstrated an understanding of the instructions.   The patient was advised to call back or seek an in-person evaluation if the symptoms worsen or if the condition fails to improve as anticipated.     Evelena Peat, MD

## 2018-11-30 ENCOUNTER — Encounter: Payer: Self-pay | Admitting: Family Medicine

## 2018-12-01 ENCOUNTER — Other Ambulatory Visit: Payer: Self-pay

## 2018-12-01 MED ORDER — PROPRANOLOL HCL ER 80 MG PO CP24: 80.0000 mg | ORAL_CAPSULE | Freq: Every day | ORAL | 0 refills | Status: DC

## 2019-03-20 ENCOUNTER — Ambulatory Visit: Payer: Managed Care, Other (non HMO) | Attending: Internal Medicine

## 2019-03-20 DIAGNOSIS — Z20822 Contact with and (suspected) exposure to covid-19: Secondary | ICD-10-CM

## 2019-03-22 LAB — NOVEL CORONAVIRUS, NAA: SARS-CoV-2, NAA: NOT DETECTED

## 2019-03-24 ENCOUNTER — Other Ambulatory Visit: Payer: Self-pay | Admitting: Family Medicine

## 2019-04-23 ENCOUNTER — Other Ambulatory Visit: Payer: Self-pay | Admitting: Family Medicine

## 2019-05-12 ENCOUNTER — Other Ambulatory Visit: Payer: Self-pay | Admitting: Family Medicine

## 2019-05-14 ENCOUNTER — Encounter: Payer: Self-pay | Admitting: Family Medicine

## 2019-05-15 ENCOUNTER — Other Ambulatory Visit: Payer: Self-pay | Admitting: *Deleted

## 2019-05-15 MED ORDER — PROPRANOLOL HCL ER 80 MG PO CP24: ORAL_CAPSULE | ORAL | 6 refills | Status: DC

## 2019-10-09 ENCOUNTER — Encounter: Payer: Self-pay | Admitting: Family Medicine

## 2019-10-11 IMAGING — DX DG KNEE COMPLETE 4+V*R*
4 series · 4 of 4 positions shown · non-contrast
Comparison: None.

CLINICAL DATA: Chronic bilateral knee pain without known injury.

EXAM:
RIGHT KNEE - COMPLETE 4+ VIEW

[dg knee complete 4 views right (1 of 4)]
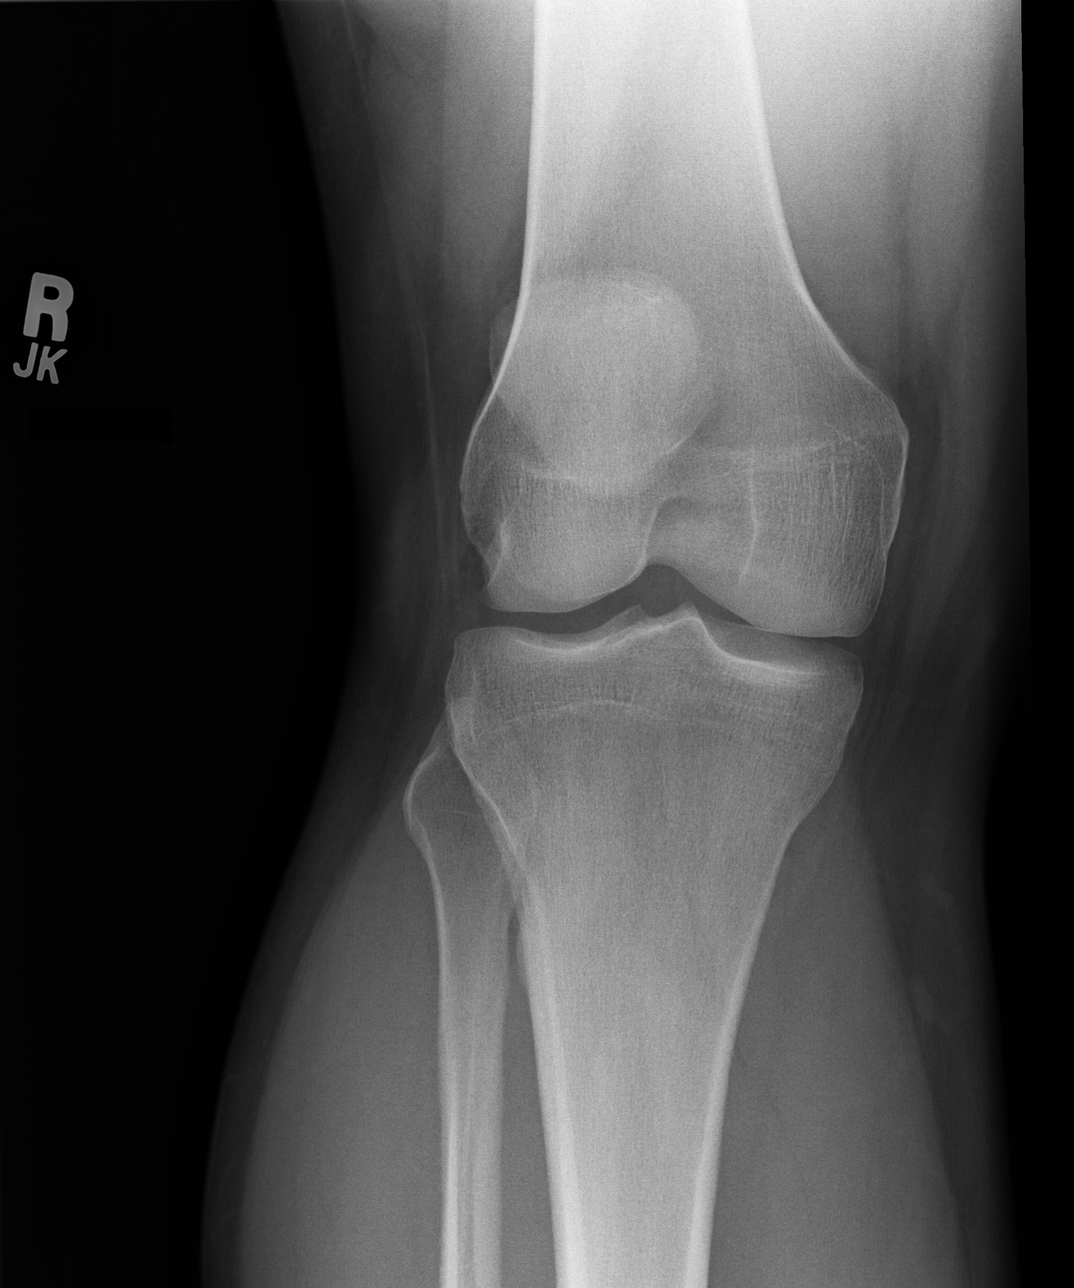

[dg knee complete 4 views right (2 of 4)]
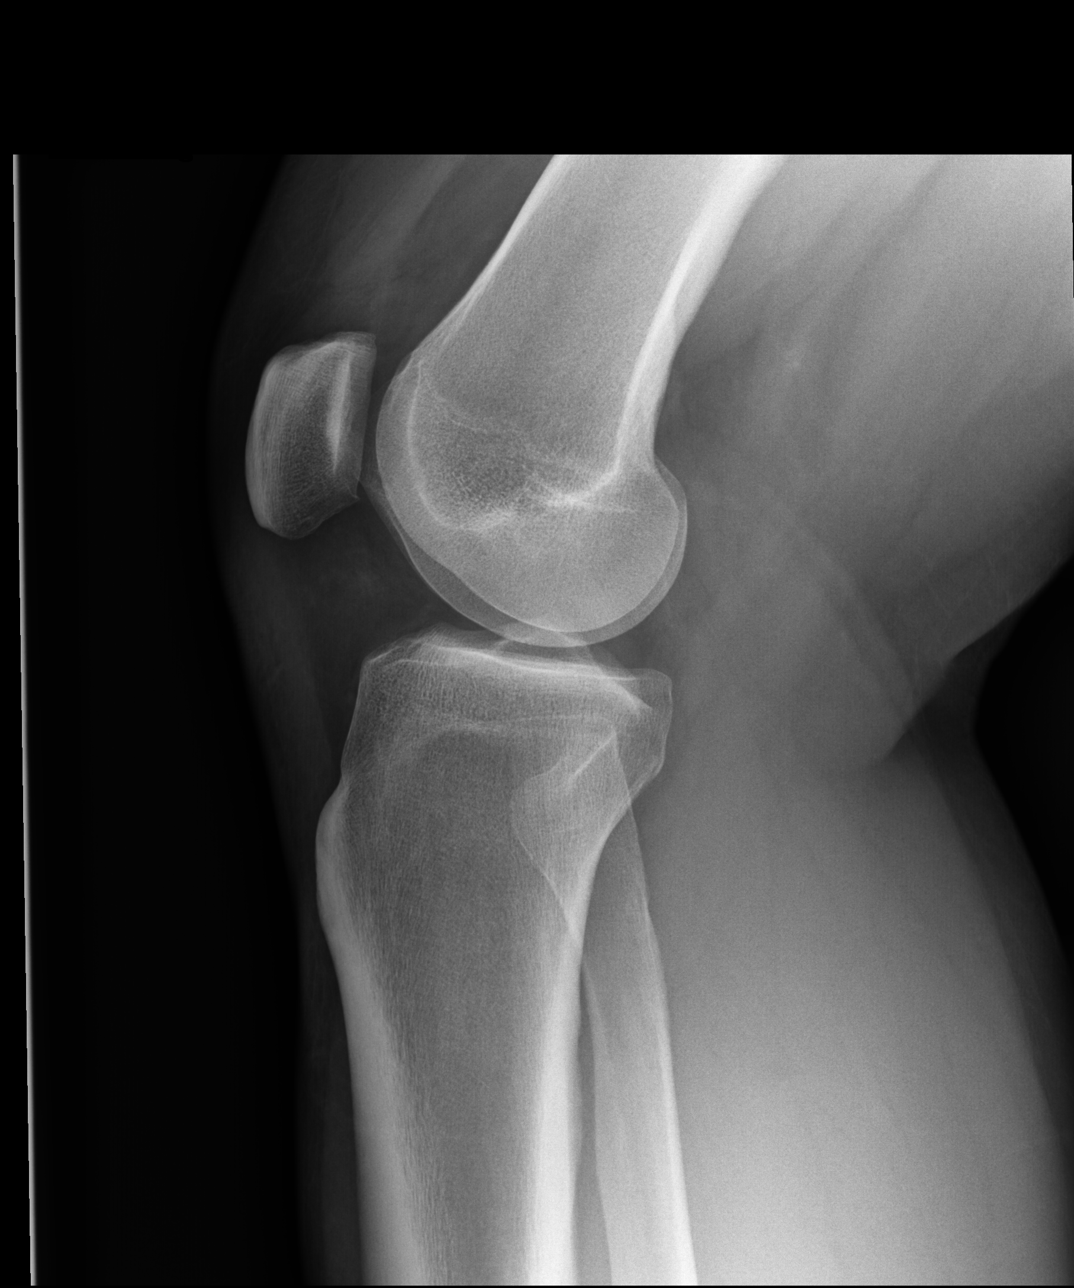

[dg knee complete 4 views right (3 of 4)]
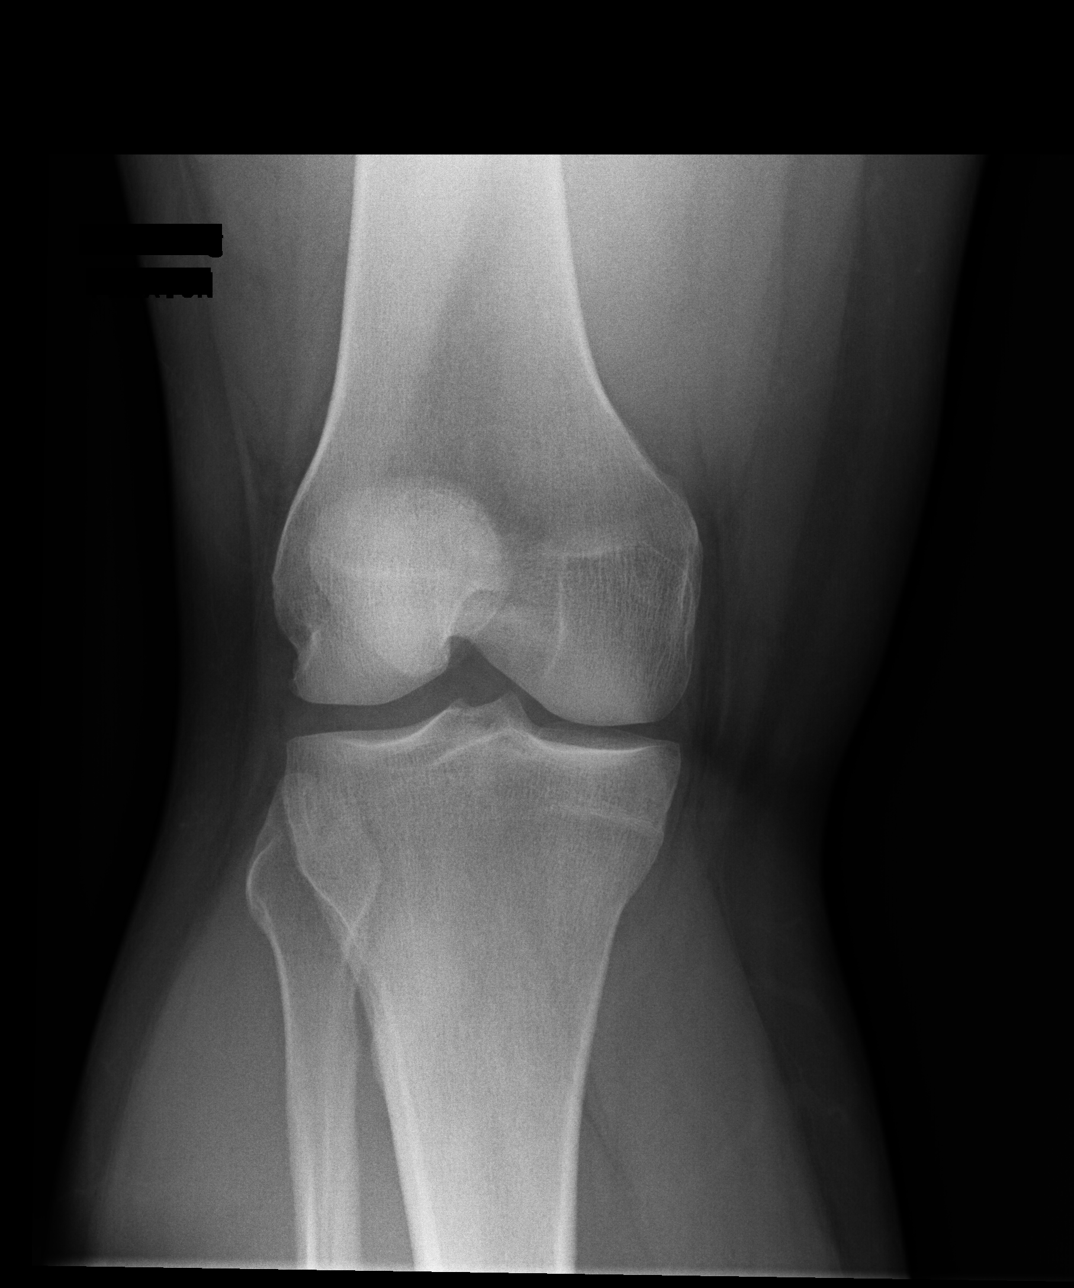

[dg knee complete 4 views right (4 of 4)]
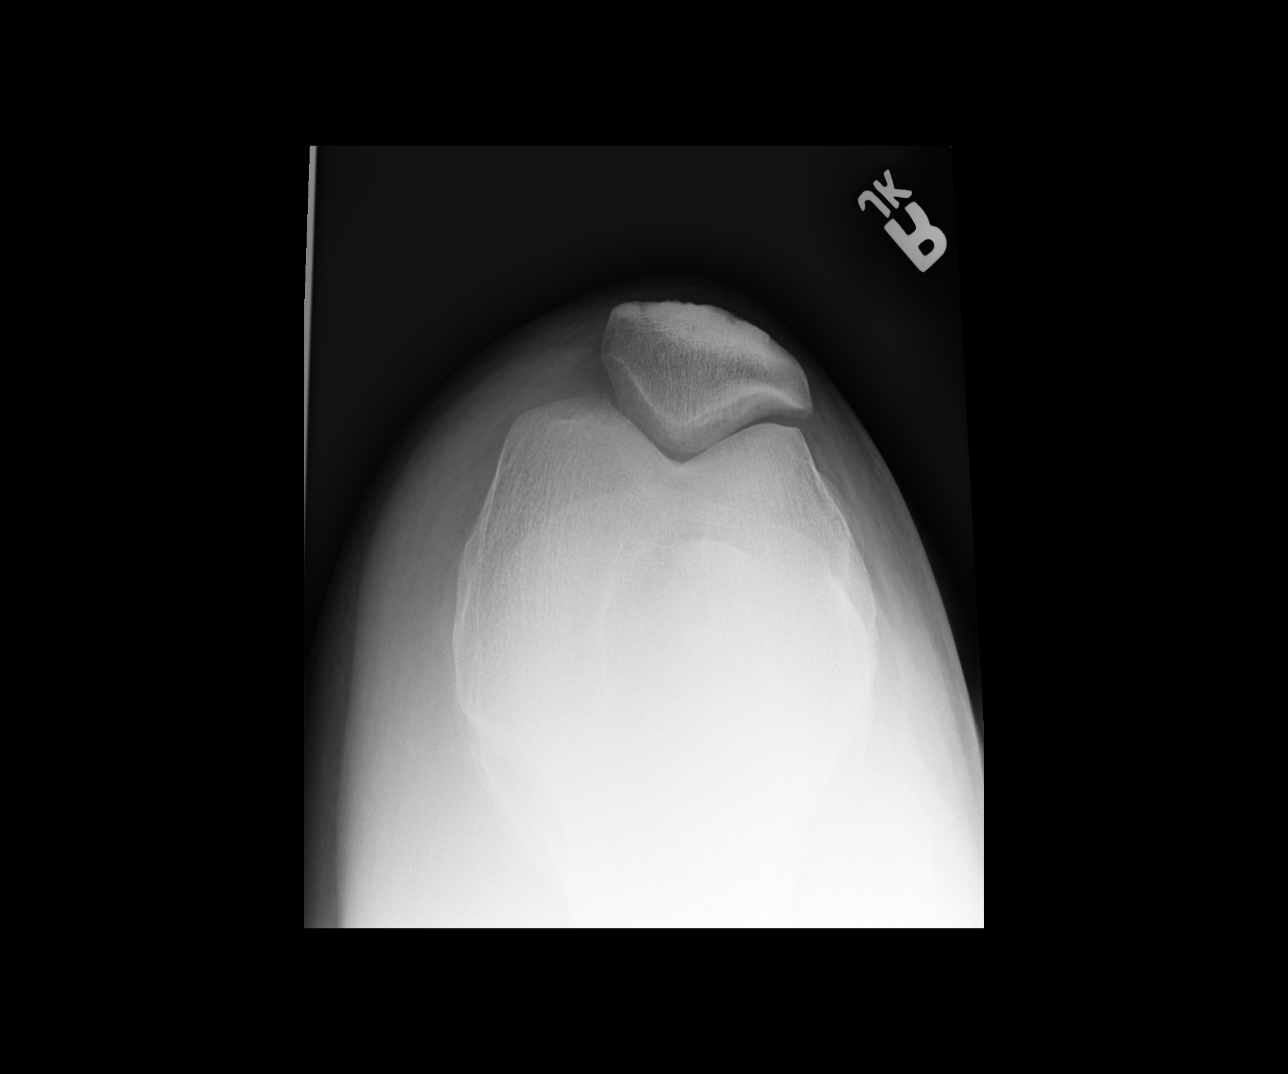

[4 of 4 positions shown; findings below may reference images not displayed]

FINDINGS: No evidence of fracture, dislocation, or joint effusion. No evidence
of arthropathy or other focal bone abnormality. Soft tissues are
unremarkable.
IMPRESSION: Normal right knee.

## 2019-11-21 ENCOUNTER — Encounter: Payer: Self-pay | Admitting: Family Medicine

## 2019-11-23 ENCOUNTER — Ambulatory Visit: Payer: Managed Care, Other (non HMO) | Admitting: Family Medicine

## 2019-12-20 ENCOUNTER — Other Ambulatory Visit: Payer: Self-pay | Admitting: Family Medicine

## 2020-01-10 ENCOUNTER — Encounter: Payer: Self-pay | Admitting: Family Medicine

## 2020-01-15 ENCOUNTER — Telehealth: Payer: Managed Care, Other (non HMO) | Admitting: Family Medicine

## 2020-01-18 ENCOUNTER — Telehealth (INDEPENDENT_AMBULATORY_CARE_PROVIDER_SITE_OTHER): Payer: Managed Care, Other (non HMO) | Admitting: Family Medicine

## 2020-01-18 ENCOUNTER — Encounter: Payer: Self-pay | Admitting: Family Medicine

## 2020-01-18 VITALS — Temp 98.7°F | Ht 72.0 in | Wt 300.0 lb

## 2020-01-18 DIAGNOSIS — F339 Major depressive disorder, recurrent, unspecified: Secondary | ICD-10-CM

## 2020-01-18 MED ORDER — FLUOXETINE HCL 20 MG PO TABS: 20.0000 mg | ORAL_TABLET | Freq: Every day | ORAL | 5 refills | Status: DC

## 2020-01-18 NOTE — Progress Notes (Signed)
Patient ID: Brian Lam, male   DOB: 10/21/1985, 34 y.o.   MRN: [id number]   This visit type was conducted due to national recommendations for restrictions regarding the COVID-19 pandemic in an effort to limit this patient's exposure and mitigate transmission in our community.   Virtual Visit via Video Note  I connected with Brian Lam on 01/18/20 at  9:00 AM EDT by a video enabled telemedicine application and verified that I am speaking with the correct person using two identifiers.  Location patient: home Location provider:work or home office Persons participating in the virtual visit: patient, provider  I discussed the limitations of evaluation and management by telemedicine and the availability of in person appointments. The patient expressed understanding and agreed to proceed.   HPI: Brian Lam had called earlier this week with report he had really several years of intermittent depression symptoms.  These are accompanied by some occasional feeling fatigued and feeling "numb".  During episodes he has fleeting thoughts about death but no desire for self-harm and no imminent thoughts of suicide.  His wife thinks these may be more seasonal related.  Episodes can last for several weeks.  He has never been treated for depression previously.  He has a degree in psychology.  He states he has absolutely no concerns for previous manic episodes.  He denies any past history of impulsivity, agitation, or high-energy type behavior.  Positive family history depression mother and maternal grandfather.  Denies any new specific stressors.  He continues to exercise during episodes and has been staying engaged in activities   ROS: See pertinent positives and negatives per HPI.  History reviewed. No pertinent past medical history.  History reviewed. No pertinent surgical history.  History reviewed. No pertinent family history.  SOCIAL HX: Married.  Non-smoker.  No regular alcohol use.  Works for [employer]   Current Outpatient Medications:  .  pantoprazole (PROTONIX) 40 MG tablet, TAKE 1 TABLET(40 MG) BY MOUTH DAILY, Disp: 90 tablet, Rfl: 1 .  propranolol ER (INDERAL LA) 80 MG 24 hr capsule, TAKE 1 CAPSULE(80 MG) BY MOUTH DAILY, Disp: 30 capsule, Rfl: 0 .  rizatriptan (MAXALT-MLT) 10 MG disintegrating tablet, Take 1 tablet (10 mg total) by mouth as needed for migraine. May repeat in 2 hours if needed, Disp: 10 tablet, Rfl: 5 .  desoximetasone (TOPICORT) 0.25 % cream, Apply 1 application topically 2 (two) times daily., Disp: 30 g, Rfl: 2 .  FLUoxetine (PROZAC) 20 MG tablet, Take 1 tablet (20 mg total) by mouth daily., Disp: 30 tablet, Rfl: 5  EXAM:  VITALS per patient if applicable:  GENERAL: alert, oriented, appears well and in no acute distress  HEENT: atraumatic, conjunttiva clear, no obvious abnormalities on inspection of external nose and ears  NECK: normal movements of the head and neck  LUNGS: on inspection no signs of respiratory distress, breathing rate appears normal, no obvious gross SOB, gasping or wheezing  CV: no obvious cyanosis  MS: moves all visible extremities without noticeable abnormality  PSYCH/NEURO: pleasant and cooperative, no obvious depression or anxiety, speech and thought processing grossly intact PHQ-9 equals 16  ASSESSMENT AND PLAN:  Discussed the following assessment and plan:  Recurrent depression.  Never treated with medication. No clinical concerns from history for bipolar disorder.  May have seasonal component to his depression.  No active suicidal ideation  -We discussed nonpharmacologic treatments including regular exercise and possible counseling. -We discussed trial of fluoxetine 20 mg once daily and get some feedback in a month  or so. -Follow-up immediately for any increased suicidal thoughts or worsening depression     I discussed the assessment and treatment plan with the patient. The patient was provided an opportunity to ask  questions and all were answered. The patient agreed with the plan and demonstrated an understanding of the instructions.   The patient was advised to call back or seek an in-person evaluation if the symptoms worsen or if the condition fails to improve as anticipated.     Evelena Peat, MD

## 2020-01-19 ENCOUNTER — Other Ambulatory Visit: Payer: Self-pay | Admitting: Family Medicine

## 2020-02-18 ENCOUNTER — Other Ambulatory Visit: Payer: Self-pay | Admitting: Family Medicine

## 2020-03-19 ENCOUNTER — Other Ambulatory Visit: Payer: Self-pay | Admitting: Family Medicine

## 2020-04-08 ENCOUNTER — Encounter: Payer: Self-pay | Admitting: Family Medicine

## 2020-04-09 MED ORDER — PROPRANOLOL HCL ER 120 MG PO CP24: 120.0000 mg | ORAL_CAPSULE | Freq: Every day | ORAL | 11 refills | Status: DC

## 2020-04-09 MED ORDER — RIZATRIPTAN BENZOATE 10 MG PO TBDP: 10.0000 mg | ORAL_TABLET | ORAL | 5 refills | Status: DC | PRN

## 2020-04-09 NOTE — Telephone Encounter (Signed)
I have refilled the Rizatriptan.  My recommendation is to increase the Inderal LA to 120 mg daily.  Frequently prophylaxis requires target dose of 160 mg we have to increase this gradually.  If you are not seeing any improvement at this dosage in the next month please let me know and we will consider at that point additional medication or possibly titrating to 160.

## 2020-04-18 ENCOUNTER — Other Ambulatory Visit: Payer: Self-pay | Admitting: Family Medicine

## 2020-05-09 ENCOUNTER — Encounter: Payer: Self-pay | Admitting: Family Medicine

## 2020-05-12 ENCOUNTER — Ambulatory Visit: Payer: Managed Care, Other (non HMO) | Admitting: Family Medicine

## 2020-07-14 ENCOUNTER — Other Ambulatory Visit: Payer: Self-pay

## 2020-07-14 ENCOUNTER — Telehealth (INDEPENDENT_AMBULATORY_CARE_PROVIDER_SITE_OTHER): Payer: Managed Care, Other (non HMO) | Admitting: Family Medicine

## 2020-07-14 DIAGNOSIS — J111 Influenza due to unidentified influenza virus with other respiratory manifestations: Secondary | ICD-10-CM

## 2020-07-14 MED ORDER — OSELTAMIVIR PHOSPHATE 75 MG PO CAPS: 75.0000 mg | ORAL_CAPSULE | Freq: Two times a day (BID) | ORAL | 0 refills | Status: DC

## 2020-07-14 NOTE — Progress Notes (Signed)
Patient ID: Brian Lam, male   DOB: 1985/12/08, 35 y.o.   MRN: [id number]   This visit type was conducted due to national recommendations for restrictions regarding the COVID-19 pandemic in an effort to limit this patient's exposure and mitigate transmission in our community.   Virtual Visit via Video Note  I connected with Myna Hidalgo on 07/14/20 at  9:30 AM EDT by a video enabled telemedicine application and verified that I am speaking with the correct person using two identifiers.  Location patient: home Location provider:work or home office Persons participating in the virtual visit: patient, provider  I discussed the limitations of evaluation and management by telemedicine and the availability of in person appointments. The patient expressed understanding and agreed to proceed.   HPI:  [name] called with onset over the weekend of flulike illness.  He states his daughter who is 34 years old [facility].  She had COVID test which was negative.  On Saturday he developed some diarrhea which was nonbloody.  Had some nausea without vomiting.  He had other symptoms including fatigue, body aches, mild headache.  He had some nasal congestion.  Minimal cough.  Symptoms were fairly rapid onset.  No dyspnea.  No localizing abdominal pain.   ROS: See pertinent positives and negatives per HPI.  No past medical history on file.  No past surgical history on file.  No family history on file.  SOCIAL HX: Non-smoker   Current Outpatient Medications:  .  desoximetasone (TOPICORT) 0.25 % cream, Apply 1 application topically 2 (two) times daily., Disp: 30 g, Rfl: 2 .  FLUoxetine (PROZAC) 20 MG tablet, Take 1 tablet (20 mg total) by mouth daily., Disp: 30 tablet, Rfl: 5 .  oseltamivir (TAMIFLU) 75 MG capsule, Take 1 capsule (75 mg total) by mouth 2 (two) times daily., Disp: 10 capsule, Rfl: 0 .  pantoprazole (PROTONIX) 40 MG tablet, TAKE 1 TABLET(40 MG) BY  MOUTH DAILY, Disp: 90 tablet, Rfl: 1 .  propranolol ER (INDERAL LA) 120 MG 24 hr capsule, Take 1 capsule (120 mg total) by mouth daily., Disp: 30 capsule, Rfl: 11 .  rizatriptan (MAXALT-MLT) 10 MG disintegrating tablet, Take 1 tablet (10 mg total) by mouth as needed for migraine. May repeat in 2 hours if needed, Disp: 10 tablet, Rfl: 5  EXAM:  VITALS per patient if applicable:  GENERAL: alert, oriented, appears well and in no acute distress  HEENT: atraumatic, conjunttiva clear, no obvious abnormalities on inspection of external nose and ears  NECK: normal movements of the head and neck  LUNGS: on inspection no signs of respiratory distress, breathing rate appears normal, no obvious gross SOB, gasping or wheezing  CV: no obvious cyanosis  MS: moves all visible extremities without noticeable abnormality  PSYCH/NEURO: pleasant and cooperative, no obvious depression or anxiety, speech and thought processing grossly intact  ASSESSMENT AND PLAN:  Discussed the following assessment and plan:  Influenza-like illness-contact with daughter who has tested positive for influenza which makes this fairly likely to be influenza.  He is keeping down fluids.  -Plenty of fluids and rest -Start Tamiflu 75 mg twice daily for 5 days -Follow-up for any persistent or worsening symptoms     I discussed the assessment and treatment plan with the patient. The patient was provided an opportunity to ask questions and all were answered. The patient agreed with the plan and demonstrated an understanding of the instructions.   The patient was advised to call back  or seek an in-person evaluation if the symptoms worsen or if the condition fails to improve as anticipated.     Evelena Peat, MD

## 2020-07-31 ENCOUNTER — Other Ambulatory Visit: Payer: Self-pay

## 2020-07-31 ENCOUNTER — Telehealth: Payer: Self-pay | Admitting: Family Medicine

## 2020-07-31 MED ORDER — FLUOXETINE HCL 20 MG PO TABS: 20.0000 mg | ORAL_TABLET | Freq: Every day | ORAL | 5 refills | Status: DC

## 2020-07-31 NOTE — Telephone Encounter (Signed)
Patient is calling and requesting a medication refill for FLUoxetine (PROZAC) 20 MG tablet to be sent to   [facility] [address], [place]  [address] [place], [place] [address]  Phone:  [phone] Fax:  [phone]  CB is [phone]

## 2020-07-31 NOTE — Telephone Encounter (Signed)
Refill has been sent to [facility] [address]

## 2020-08-07 ENCOUNTER — Telehealth (INDEPENDENT_AMBULATORY_CARE_PROVIDER_SITE_OTHER): Payer: Managed Care, Other (non HMO) | Admitting: Family Medicine

## 2020-08-07 ENCOUNTER — Encounter: Payer: Self-pay | Admitting: Family Medicine

## 2020-08-07 VITALS — Wt 300.0 lb

## 2020-08-07 DIAGNOSIS — U071 COVID-19: Secondary | ICD-10-CM | POA: Diagnosis not present

## 2020-08-07 MED ORDER — BENZONATATE 100 MG PO CAPS: 100.0000 mg | ORAL_CAPSULE | Freq: Three times a day (TID) | ORAL | 0 refills | Status: DC | PRN

## 2020-08-07 NOTE — Patient Instructions (Addendum)
   ---------------------------------------------------------------------------------------------------------------------------      WORK SLIP:  Patient Brian Lam,  1985/09/23, was seen for a medical visit today, 08/07/20 . Please excuse from work for a COVID like illness. We advise 10 days minimum from the onset of symptoms (08/03/20) PLUS 1 day of no fever and improved symptoms. Will defer to employer for a sooner return to work if symptoms have resolved, it is greater than 5 days since the positive test and the patient can wear a high-quality, tight fitting mask such as N95 or KN95 at all times for an additional 5 days. Would also suggest COVID19 antigen testing is negative prior to return.  Sincerely: E-signature: Dr. Kriste Basque, DO [facility] Ph: [phone]   ------------------------------------------------------------------------------------------------------------------------------   HOME CARE TIPS:   -I sent the medication(s) we discussed to your pharmacy: Meds ordered this encounter  Medications  . benzonatate (TESSALON PERLES) 100 MG capsule    Sig: Take 1 capsule (100 mg total) by mouth 3 (three) times daily as needed.    Dispense:  20 capsule    Refill:  0     -can use tylenol or aleve if needed for fevers, aches and pains per instructions  -can use nasal saline a few times per day if you have nasal congestion; sometimes  a short course of Afrin nasal spray for 3 days can help with symptoms as well  -stay hydrated, drink plenty of fluids and eat small healthy meals - avoid dairy  -can take 1000 IU ( ) Vit D3 and 100-500 mg of Vit C daily per instructions  -If the Covid test is positive, check out the [facility] website for more information on home care, transmission and treatment for COVID19  -follow up with your doctor in 2-3 days unless improving and feeling better  -stay home while sick, except to seek medical care. If you have  COVID19, ideally it would be best to stay home for a full 10 days since the onset of symptoms PLUS one day of no fever and feeling better. Wear a good mask that fits snugly (such as N95 or KN95) if around others to reduce the risk of transmission.  It was nice to meet you today, and I really hope you are feeling better soon. I help [facility] out with telemedicine visits on Tuesdays and Thursdays and am available for visits on those days. If you have any concerns or questions following this visit please schedule a follow up visit with your Primary Care doctor or seek care at a local urgent care clinic to avoid delays in care.    Seek in person care or schedule a follow up video visit promptly if your symptoms worsen, new concerns arise or you are not improving with treatment. Call 911 and/or seek emergency care if your symptoms are severe or life threatening.

## 2020-08-07 NOTE — Progress Notes (Signed)
Virtual Visit via Video Note  I connected with Dan  on 08/07/20 at  3:00 PM EDT by a video enabled telemedicine application and verified that I am speaking with the correct person using two identifiers.  Location patient: home, [place] Location provider:work or home office Persons participating in the virtual visit: patient, provider  I discussed the limitations of evaluation and management by telemedicine and the availability of in person appointments. The patient expressed understanding and agreed to proceed.   HPI:  Acute telemedicine visit for cough and congestion: -Onset: 4 days ago -Symptoms include:sore throat, cough, nasal congestion, body aches -Denies: Cp, SOB, NVD, inability to eat/drink/get out of bed -2 children in his household have tested positive for covid in the last week -patient has taken several test -Pertinent past medical history: none significant per patient, admits to being "overweight" -Pertinent medication allergies: No Known Allergies -COVID-19 vaccine status: fully vaccinated and had booster  ROS: See pertinent positives and negatives per HPI.  History reviewed. No pertinent past medical history.  History reviewed. No pertinent surgical history.   Current Outpatient Medications:  .  benzonatate (TESSALON PERLES) 100 MG capsule, Take 1 capsule (100 mg total) by mouth 3 (three) times daily as needed., Disp: 20 capsule, Rfl: 0 .  desoximetasone (TOPICORT) 0.25 % cream, Apply 1 application topically 2 (two) times daily., Disp: 30 g, Rfl: 2 .  FLUoxetine (PROZAC) 20 MG tablet, Take 1 tablet (20 mg total) by mouth daily., Disp: 30 tablet, Rfl: 5 .  OMEPRAZOLE PO, Take by mouth., Disp: , Rfl:  .  propranolol ER (INDERAL LA) 120 MG 24 hr capsule, Take 1 capsule (120 mg total) by mouth daily., Disp: 30 capsule, Rfl: 11 .  rizatriptan (MAXALT-MLT) 10 MG disintegrating tablet, Take 1 tablet (10 mg total) by mouth as needed for migraine. May repeat in 2 hours if needed,  Disp: 10 tablet, Rfl: 5  EXAM:  VITALS per patient if applicable:  GENERAL: alert, oriented, appears well and in no acute distress  HEENT: atraumatic, conjunttiva clear, no obvious abnormalities on inspection of external nose and ears  NECK: normal movements of the head and neck  LUNGS: on inspection no signs of respiratory distress, breathing rate appears normal, no obvious gross SOB, gasping or wheezing  CV: no obvious cyanosis  MS: moves all visible extremities without noticeable abnormality  PSYCH/NEURO: pleasant and cooperative, no obvious depression or anxiety, speech and thought processing grossly intact  ASSESSMENT AND PLAN:  Discussed the following assessment and plan:  COVID-19  -we discussed possible serious and likely etiologies, options for evaluation and workup, limitations of telemedicine visit vs in person visit, treatment, treatment risks and precautions. Pt prefers to treat via telemedicine empirically rather than in person at this moment. Suspect [phone] most likely given exposure and symptoms. Possible VURI or influeza vs other less likely. Probably had false negative testing. He plans to test again today.  Discussed treatment options, ideal treatment window, potential complications, isolation and precautions for COVID-19. He was not interested in EUA treatment if positive test at this time. Tessalon rx sent for cough.  Other symptomatic care measures summarized in patient instructions. Work/School slipped offered: provided in patient instructions  Scheduled follow up with PCP offered: follow up as needed or if positive test and changes mind about treatment. Advised to seek prompt in person care if worsening, new symptoms arise, or if is not improving with treatment. Discussed options for inperson care if PCP office not available. Did let this patient  know that I only do telemedicine on Tuesdays and Thursdays for [facility]. Advised to schedule follow up visit with PCP  or [facility] if any further questions or concerns to avoid delays in care.   I discussed the assessment and treatment plan with the patient. The patient was provided an opportunity to ask questions and all were answered. The patient agreed with the plan and demonstrated an understanding of the instructions.     Terressa Koyanagi, DO

## 2020-08-20 ENCOUNTER — Telehealth: Payer: Self-pay | Admitting: Family Medicine

## 2020-08-20 ENCOUNTER — Encounter: Payer: Self-pay | Admitting: Family Medicine

## 2020-08-20 DIAGNOSIS — K219 Gastro-esophageal reflux disease without esophagitis: Secondary | ICD-10-CM

## 2020-08-20 NOTE — Telephone Encounter (Signed)
Patient relates he is having some almost daily GERD symptoms in spite of daily PPI therapy.  Recommend setting up GI evaluation and patient agrees.  Referral made.

## 2020-08-28 ENCOUNTER — Encounter: Payer: Self-pay | Admitting: Gastroenterology

## 2020-10-01 ENCOUNTER — Encounter: Payer: Self-pay | Admitting: Gastroenterology

## 2020-10-01 ENCOUNTER — Ambulatory Visit: Payer: Managed Care, Other (non HMO) | Admitting: Gastroenterology

## 2020-10-01 ENCOUNTER — Other Ambulatory Visit (INDEPENDENT_AMBULATORY_CARE_PROVIDER_SITE_OTHER): Payer: Managed Care, Other (non HMO)

## 2020-10-01 VITALS — BP 102/80 | HR 86 | Ht 72.0 in | Wt 327.0 lb

## 2020-10-01 DIAGNOSIS — K219 Gastro-esophageal reflux disease without esophagitis: Secondary | ICD-10-CM

## 2020-10-01 DIAGNOSIS — K529 Noninfective gastroenteritis and colitis, unspecified: Secondary | ICD-10-CM | POA: Diagnosis not present

## 2020-10-01 LAB — CBC WITH DIFFERENTIAL/PLATELET
Basophils Absolute: 0.1 10*3/uL (ref 0.0–0.1)
Basophils Relative: 0.8 % (ref 0.0–3.0)
Eosinophils Absolute: 0.2 10*3/uL (ref 0.0–0.7)
Eosinophils Relative: 2.3 % (ref 0.0–5.0)
HCT: 41.4 % (ref 39.0–52.0)
Hemoglobin: 14.3 g/dL (ref 13.0–17.0)
Lymphocytes Relative: 40 % (ref 12.0–46.0)
Lymphs Abs: 3.1 10*3/uL (ref 0.7–4.0)
MCHC: 34.6 g/dL (ref 30.0–36.0)
MCV: 83.1 fl (ref 78.0–100.0)
Monocytes Absolute: 0.7 10*3/uL (ref 0.1–1.0)
Monocytes Relative: 9 % (ref 3.0–12.0)
Neutro Abs: 3.7 10*3/uL (ref 1.4–7.7)
Neutrophils Relative %: 47.9 % (ref 43.0–77.0)
Platelets: 202 10*3/uL (ref 150.0–400.0)
RBC: 4.98 Mil/uL (ref 4.22–5.81)
RDW: 13.4 % (ref 11.5–15.5)
WBC: 7.7 10*3/uL (ref 4.0–10.5)

## 2020-10-01 LAB — COMPREHENSIVE METABOLIC PANEL
ALT: 32 U/L (ref 0–53)
AST: 23 U/L (ref 0–37)
Albumin: 4.5 g/dL (ref 3.5–5.2)
Alkaline Phosphatase: 45 U/L (ref 39–117)
BUN: 14 mg/dL (ref 6–23)
CO2: 26 mEq/L (ref 19–32)
Calcium: 9.2 mg/dL (ref 8.4–10.5)
Chloride: 104 mEq/L (ref 96–112)
Creatinine, Ser: 1.15 mg/dL (ref 0.40–1.50)
GFR: 81.61 mL/min (ref >60.00)
Glucose, Bld: 84 mg/dL (ref 70–99)
Potassium: 4.2 mEq/L (ref 3.5–5.1)
Sodium: 138 mEq/L (ref 135–145)
Total Bilirubin: 0.6 mg/dL (ref 0.2–1.2)
Total Protein: 7.2 g/dL (ref 6.0–8.3)

## 2020-10-01 LAB — SEDIMENTATION RATE: Sed Rate: 6 mm/hr (ref 0–15)

## 2020-10-01 NOTE — Progress Notes (Addendum)
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  It has been bothering him over a decade causing heartburn and regurgitation including nocturnal symptoms more so in the last year.  In recent months he has woken at least once a week with feelings of regurgitation at night with coughing and sore throat or hoarseness the next day.  He is taking acid suppression as noted below, has last meal of the day about 4 hours before bed and is a non-smoker.  He has put on at least 25 pounds in the last couple of years, somewhat more sedentary at work than he would like.  Denies dysphagia or odynophagia, nausea or vomiting. For many years his typical bowel pattern is 4-6 BMs per day, no urgency.  Stool often formed but sometimes semiformed to loose, always nonbloody.  He denies focal abdominal pain, but has some bloating and occasional cramps.  His wife thinks perhaps he has IBS, he has had no previous evaluation.   ROS:  Review of Systems  Constitutional:  Negative for appetite change and unexpected weight change.  HENT:  Negative for mouth sores and voice change.   Eyes:  Negative for pain and redness.  Respiratory:  Negative for cough and shortness of breath.   Cardiovascular:  Negative for chest pain and palpitations.  Genitourinary:  Negative for dysuria and hematuria.  Musculoskeletal:  Negative for arthralgias and myalgias.  Skin:  Negative for pallor and rash.  Neurological:  Negative for weakness and headaches.   Hematological:  Negative for adenopathy.    Past Medical History: Past Medical History:  Diagnosis Date   Chronic knee pain    Contact dermatitis    Depression    GERD (gastroesophageal reflux disease)    Migraines      Past Surgical History: Past Surgical History:  Procedure Laterality Date   VASECTOMY  2020     Family History: Family History  Problem Relation Age of Onset   Diabetes type II Father    Heart attack Maternal Grandfather    Diabetes Maternal Grandfather    Colon cancer Neg Hx    Esophageal cancer Neg Hx    Stomach cancer Neg Hx     Social History: Social History   Socioeconomic History   Marital status: Married    Spouse name: Not on file   Number of children: Not on file   Years of education: Not on file   Highest education level: Not on file  Occupational History   Not on file  Tobacco Use   Smoking status: Never   Smokeless tobacco: Never  Vaping Use   Vaping Use: Never used  Substance and Sexual Activity   Alcohol use: Yes    Comment: 2 to 3 drinks a week   Drug use: No   Sexual activity: Not on file  Other Topics Concern   Not on file  Social History Narrative   Not on file   Social Determinants of Health   Financial Resource Strain: Not on file  Food Insecurity: Not on file  Transportation Needs: Not on file  Physical Activity: Not  on file  Stress: Not on file  Social Connections: Not on file   Homicide detective in [employer]  Allergies: No Known Allergies  Outpatient Meds: Current Outpatient Medications  Medication Sig Dispense Refill   desoximetasone (TOPICORT) 0.25 % cream Apply 1 application topically 2 (two) times daily. 30 g 2   famotidine (PEPCID) 20 MG tablet Take 20 mg by mouth at bedtime.     FLUoxetine (PROZAC) 20 MG tablet Take 1 tablet (20 mg total) by mouth daily. 30 tablet 5   omeprazole (PRILOSEC) 20 MG capsule Take by mouth daily.     propranolol ER (INDERAL LA) 120 MG 24 hr capsule Take 1 capsule  (120 mg total) by mouth daily. 30 capsule 11   rizatriptan (MAXALT-MLT) 10 MG disintegrating tablet Take 1 tablet (10 mg total) by mouth as needed for migraine. May repeat in 2 hours if needed 10 tablet 5   No current facility-administered medications for this visit.      ___________________________________________________________________ Objective   Exam:  BP 102/80   Pulse 86   Ht 6' (1.829 m)   Wt (!) 327 lb (148.3 kg)   SpO2 97%   BMI 44.35 kg/m  Wt Readings from Last 3 Encounters:  10/01/20 (!) 327 lb (148.3 kg)  08/07/20 300 lb (136.1 kg)  01/18/20 300 lb (136.1 kg)    General: Well-appearing, pleasant and conversational.  Normal vocal quality. Eyes: sclera anicteric, no redness ENT: oral mucosa moist without lesions, no cervical or supraclavicular lymphadenopathy CV: RRR without murmur, S1/S2, no JVD, bilateral pretibial edema, symmetric Resp: clear to auscultation bilaterally, normal RR and effort noted GI: soft, morbidly obese, no tenderness, with active bowel sounds. No guarding or palpable organomegaly noted. Skin; warm and dry, no rash or jaundice noted Neuro: awake, alert and oriented x 3. Normal gross motor function and fluent speech  Labs:  No CBC, CMP on file    Assessment: Encounter Diagnoses  Name Primary?   Gastroesophageal reflux disease, unspecified whether esophagitis present Yes   Chronic diarrhea    Many years of GERD, worsening in the last year 2 including nocturnal symptoms that are worrisome.  Less likely achalasia without reports of dysphagia.  We discussed the limitation of acid suppression therapy, breadth of diet and lifestyle changes required for reflux control, the possibility of reflux related complications such as esophagitis, stricture or Barrett's esophagus.  Other anatomic considerations such as gastric outlet obstruction or hiatal hernia may contribute to reflux.  Diarrhea does sound much like IBS, perhaps some  maldigestion/dietary triggers.  (Written dietary advice given regarding that).  Less likely Crohn's or UC.  I offered a trial of antispasmodic, but he would prefer not to take any other meds at this point.  He says this is been his bowel pattern many years, he is not particularly bothered by it, definitely focusing more on his reflux symptoms.  Plan: Upper endoscopy.  He was agreeable after discussion of procedure and risks.  The benefits and risks of the planned procedure were described in detail with the patient or (when appropriate) their health care proxy.  Risks were outlined as including, but not limited to, bleeding, infection, perforation, adverse medication reaction leading to cardiac or pulmonary decompensation, pancreatitis (if ERCP).  The limitation of incomplete mucosal visualization was also discussed.  No guarantees or warranties were given.  Antireflux diet and lifestyle measures reviewed.  Elevating head of bed would help, recommended foam bed wedge. No change in reflux meds at this point,  will reevaluate after endoscopic findings.  CBC, CMP, ESR, celiac antibodies as initial work-up for diarrhea.  Weight loss would help his reflux symptoms, probably the most challenging thing to change but he seems committed to that from our conversation today.  Thank you for the courtesy of this consult.  Please call me with any questions or concerns.  Nelida Meuse III  CC: Referring provider noted above

## 2020-10-01 NOTE — Patient Instructions (Addendum)
If you are age 37 or older, your body mass index should be between 23-30. Your Body mass index is 44.35 kg/m. If this is out of the aforementioned range listed, please consider follow up with your Primary Care Provider.  If you are age 30 or younger, your body mass index should be between 19-25. Your Body mass index is 44.35 kg/m. If this is out of the aformentioned range listed, please consider follow up with your Primary Care Provider.   __________________________________________________________ Your provider has requested that you go to the basement level for lab work before leaving today. Press "B" on the elevator. The lab is located at the first door on the left as you exit the elevator.  Due to recent changes in healthcare laws, you may see the results of your imaging and laboratory studies on MyChart before your provider has had a chance to review them.  We understand that in some cases there may be results that are confusing or concerning to you. Not all laboratory results come back in the same time frame and the provider may be waiting for multiple results in order to interpret others.  Please give [place] 48 hours in order for your provider to thoroughly review all the results before contacting the office for clarification of your results.     The [place] [facility] providers would like to encourage you to use [facility] to communicate with providers for non-urgent requests or questions.  Due to long hold times on the telephone, sending your provider a message by [facility] may be a faster and more efficient way to get a response.  Please allow 48 business hours for a response.  Please remember that this is for non-urgent requests.   You have been scheduled for an endoscopy. Please follow written instructions given to you at your visit today. If you use inhalers (even only as needed), please bring them with you on the day of your procedure.  It was a pleasure to see you today!  Thank you for trusting me  with your gastrointestinal care!

## 2020-10-02 LAB — TISSUE TRANSGLUTAMINASE, IGA: (tTG) Ab, IgA: 1 U/mL

## 2020-10-02 LAB — IGA: Immunoglobulin A: 179 mg/dL (ref 47–310)

## 2020-10-10 ENCOUNTER — Ambulatory Visit (AMBULATORY_SURGERY_CENTER): Payer: Managed Care, Other (non HMO) | Admitting: Gastroenterology

## 2020-10-10 ENCOUNTER — Other Ambulatory Visit: Payer: Self-pay

## 2020-10-10 ENCOUNTER — Encounter: Payer: Self-pay | Admitting: Gastroenterology

## 2020-10-10 VITALS — BP 104/66 | HR 94 | Temp 98.0°F | Resp 19 | Ht 72.0 in | Wt 327.0 lb

## 2020-10-10 DIAGNOSIS — K219 Gastro-esophageal reflux disease without esophagitis: Secondary | ICD-10-CM

## 2020-10-10 MED ORDER — SODIUM CHLORIDE 0.9 % IV SOLN: 500.0000 mL | Freq: Once | INTRAVENOUS | Status: DC

## 2020-10-10 NOTE — Progress Notes (Signed)
Called to room to assist during endoscopic procedure.  Patient ID and intended procedure confirmed with present staff. Received instructions for my participation in the procedure from the performing physician.  

## 2020-10-10 NOTE — Patient Instructions (Signed)
Resume previous diet and medications. Awaiting pathology results. Medication regimen may change based on pathology results. Follow a antireflux regimen indefinitely.  YOU HAD AN ENDOSCOPIC PROCEDURE TODAY AT THE [facility]:   Refer to the procedure report that was given to you for any specific questions about what was found during the examination.  If the procedure report does not answer your questions, please call your gastroenterologist to clarify.  If you requested that your care partner not be given the details of your procedure findings, then the procedure report has been included in a sealed envelope for you to review at your convenience later.  YOU SHOULD EXPECT: Some feelings of bloating in the abdomen. Passage of more gas than usual.  Walking can help get rid of the air that was put into your GI tract during the procedure and reduce the bloating. If you had a lower endoscopy (such as a colonoscopy or flexible sigmoidoscopy) you may notice spotting of blood in your stool or on the toilet paper. If you underwent a bowel prep for your procedure, you may not have a normal bowel movement for a few days.  Please Note:  You might notice some irritation and congestion in your nose or some drainage.  This is from the oxygen used during your procedure.  There is no need for concern and it should clear up in a day or so.  SYMPTOMS TO REPORT IMMEDIATELY:  Following upper endoscopy (EGD)  Vomiting of blood or coffee ground material  New chest pain or pain under the shoulder blades  Painful or persistently difficult swallowing  New shortness of breath  Fever of 100F or higher  Black, tarry-looking stools  For urgent or emergent issues, a gastroenterologist can be reached at any hour by calling [phone]. Do not use MyChart messaging for urgent concerns.    DIET:  We do recommend a small meal at first, but then you may proceed to your regular diet.  Drink plenty of fluids but  you should avoid alcoholic beverages for 24 hours.  ACTIVITY:  You should plan to take it easy for the rest of today and you should NOT DRIVE or use heavy machinery until tomorrow (because of the sedation medicines used during the test).    FOLLOW UP: Our staff will call the number listed on your records 48-72 hours following your procedure to check on you and address any questions or concerns that you may have regarding the information given to you following your procedure. If we do not reach you, we will leave a message.  We will attempt to reach you two times.  During this call, we will ask if you have developed any symptoms of COVID 19. If you develop any symptoms (ie: fever, flu-like symptoms, shortness of breath, cough etc.) before then, please call [phone].  If you test positive for Covid 19 in the 2 weeks post procedure, please call and report this information to [place].    If any biopsies were taken you will be contacted by phone or by letter within the next 1-3 weeks.  Please call us at [phone] if you have not heard about the biopsies in 3 weeks.    SIGNATURES/CONFIDENTIALITY: You and/or your care partner have signed paperwork which will be entered into your electronic medical record.  These signatures attest to the fact that that the information above on your After Visit Summary has been reviewed and is understood.  Full responsibility of the confidentiality of this discharge  information lies with you and/or your care-partner.

## 2020-10-10 NOTE — Progress Notes (Signed)
pt tolerated well. VSS. awake and to recovery. Report given to RN.  

## 2020-10-10 NOTE — Op Note (Signed)
[facility] Patient Name: Brian Lam Procedure Date: 10/10/2020 10:00 AM MRN: [id number] Endoscopist: Sherilyn Cooter L. Myrtie Neither , MD Age: 35 Referring MD:  Date of Birth: May 15, 1985 Gender: Male Account #: [id number] Procedure:                Upper GI endoscopy Indications:              Esophageal reflux symptoms that persist despite                            appropriate therapy                           Nocturnal reflux symptoms as well. No dysphagia Medicines:                Monitored Anesthesia Care Procedure:                Pre-Anesthesia Assessment:                           - Prior to the procedure, a History and Physical                            was performed, and patient medications and                            allergies were reviewed. The patient's tolerance of                            previous anesthesia was also reviewed. The risks                            and benefits of the procedure and the sedation                            options and risks were discussed with the patient.                            All questions were answered, and informed consent                            was obtained. Prior Anticoagulants: The patient has                            taken no previous anticoagulant or antiplatelet                            agents. ASA Grade Assessment: III - A patient with                            severe systemic disease. After reviewing the risks                            and benefits, the patient was deemed in  satisfactory condition to undergo the procedure.                           After obtaining informed consent, the endoscope was                            passed under direct vision. Throughout the                            procedure, the patient's blood pressure, pulse, and                            oxygen saturations were monitored continuously. The                            GIF [id number] #[id number] was introduced  through the                            mouth, and advanced to the second part of duodenum.                            The upper GI endoscopy was accomplished without                            difficulty. The patient tolerated the procedure                            fairly well. Scope In: Scope Out: Findings:                 Diffuse mucosal changes characterized by                            longitudinal markings were found in the middle                            third of the esophagus and in the lower third of                            the esophagus. Multiple biopsies were obtained in                            the middle third of the esophagus and in the lower                            third of the esophagus with cold forceps for                            evaluation of eosinophilic esophagitis.                           There is no endoscopic evidence of Barrett's  esophagus, esophagitis, hiatal hernia or stricture                            in the entire esophagus.                           The stomach was normal.                           The cardia and gastric fundus were normal on                            retroflexion.                           The examined duodenum was normal. Complications:            No immediate complications. Estimated Blood Loss:     Estimated blood loss was minimal. Impression:               - Longitudinally marked mucosa in the esophagus.                           - Normal stomach.                           - Normal examined duodenum.                           - Multiple biopsies were obtained in the middle                            third of the esophagus and in the lower third of                            the esophagus. Recommendation:           - Patient has a contact number available for                            emergencies. The signs and symptoms of potential                            delayed complications were  discussed with the                            patient. Return to normal activities tomorrow.                            Written discharge instructions were provided to the                            patient.                           - Resume previous diet.                           -  Continue present medications.                           - Await pathology results. Medication changes may                            follow, depending on results.                           - Follow an antireflux regimen indefinitely.                            Elevate head of bed and weight loss likely most                            helpful changes. [name] L. Myrtie Neither, MD 10/10/2020 10:32:18 AM This report has been signed electronically.

## 2020-10-14 ENCOUNTER — Telehealth: Payer: Self-pay | Admitting: *Deleted

## 2020-10-14 ENCOUNTER — Telehealth: Payer: Self-pay

## 2020-10-14 NOTE — Telephone Encounter (Signed)
Left message on follow up call. 

## 2020-10-14 NOTE — Telephone Encounter (Signed)
  Follow up Call-  Call back number 10/10/2020  Post procedure Call Back phone  # [phone]  Permission to leave phone message Yes  Some recent data might be hidden     No answer at 2nd attempt follow up phone call.  Left message on voicemail.

## 2021-01-08 ENCOUNTER — Encounter: Payer: Self-pay | Admitting: Family Medicine

## 2021-01-08 ENCOUNTER — Other Ambulatory Visit: Payer: Self-pay

## 2021-01-09 ENCOUNTER — Ambulatory Visit: Payer: Managed Care, Other (non HMO) | Admitting: Family Medicine

## 2021-01-09 VITALS — BP 134/80 | HR 95 | Temp 97.9°F | Wt 330.5 lb

## 2021-01-09 DIAGNOSIS — J0110 Acute frontal sinusitis, unspecified: Secondary | ICD-10-CM | POA: Diagnosis not present

## 2021-01-09 MED ORDER — AMOXICILLIN-POT CLAVULANATE 875-125 MG PO TABS: 1.0000 | ORAL_TABLET | Freq: Two times a day (BID) | ORAL | 0 refills | Status: DC

## 2021-01-09 NOTE — Progress Notes (Signed)
Established Patient Office Visit  Subjective:  Patient ID: Brian Lam, male    DOB: [birth year]  Age: 35 y.o. MRN: [id number]  CC:  Chief Complaint  Patient presents with   Nasal Congestion    X 2 weeks, nasal and chest congestion, pain on R side of face, right side ear pressure.     HPI Brian Lam presents for recent right periorbital pain and unilateral headache.  He states he had typical cold-like symptoms couple weeks ago.  Developed some achy quality pain around his right frontal sinus region.  He states couple days ago he woke up and had some redness of the right eye but no periorbital edema.  He does have history of migraine headaches but states this headache was different.  He seemed to have some spread of pain down toward his face and into the jaw or region.  No visual changes.  Had also developed some thick greenish nasal discharge.  His wife suggest that this could be a sinus infection and he had some leftover Augmentin which she started on yesterday.  After taking just 1 dose felt better within hours.  Overall improved today.  Headache much improved.  Past Medical History:  Diagnosis Date   Chronic knee pain    Contact dermatitis    Depression    GERD (gastroesophageal reflux disease)    Migraines     Past Surgical History:  Procedure Laterality Date   VASECTOMY  2020    Family History  Problem Relation Age of Onset   Diabetes type II Father    Heart attack Maternal Grandfather    Diabetes Maternal Grandfather    Colon cancer Neg Hx    Esophageal cancer Neg Hx    Stomach cancer Neg Hx    Rectal cancer Neg Hx     Social History   Socioeconomic History   Marital status: Married    Spouse name: Not on file   Number of children: Not on file   Years of education: Not on file   Highest education level: Not on file  Occupational History   Not on file  Tobacco Use   Smoking status: Never   Smokeless tobacco: Never  Vaping Use   Vaping Use: Never used   Substance and Sexual Activity   Alcohol use: Yes    Comment: 2 to 3 drinks a week   Drug use: No   Sexual activity: Not on file  Other Topics Concern   Not on file  Social History Narrative   Not on file   Social Determinants of Health   Financial Resource Strain: Not on file  Food Insecurity: Not on file  Transportation Needs: Not on file  Physical Activity: Not on file  Stress: Not on file  Social Connections: Not on file  Intimate Partner Violence: Not on file    Outpatient Medications Prior to Visit  Medication Sig Dispense Refill   albuterol (VENTOLIN HFA) 108 (90 Base) MCG/ACT inhaler      desoximetasone (TOPICORT) 0.25 % cream Apply 1 application topically 2 (two) times daily. 30 g 2   famotidine (PEPCID) 20 MG tablet Take 20 mg by mouth at bedtime.     FLUoxetine (PROZAC) 20 MG tablet Take 1 tablet (20 mg total) by mouth daily. 30 tablet 5   omeprazole (PRILOSEC) 20 MG capsule Take by mouth daily.     propranolol ER (INDERAL LA) 120 MG 24 hr capsule Take 1 capsule (120 mg total) by mouth daily. 30  capsule 11   rizatriptan (MAXALT-MLT) 10 MG disintegrating tablet Take 1 tablet (10 mg total) by mouth as needed for migraine. May repeat in 2 hours if needed 10 tablet 5   Facility-Administered Medications Prior to Visit  Medication Dose Route Frequency Provider Last Rate Last Admin   0.9 %  sodium chloride infusion  500 mL Intravenous Once Charlie Pitter III, MD        No Known Allergies  ROS Review of Systems  Constitutional:  Negative for chills and fever.  HENT:  Positive for sinus pressure and sinus pain.   Eyes:  Negative for visual disturbance.  Respiratory:  Negative for cough and shortness of breath.   Neurological:  Positive for headaches.     Objective:    Physical Exam Vitals reviewed.  Constitutional:      Appearance: Normal appearance.  HENT:     Right Ear: Tympanic membrane normal.     Left Ear: Tympanic membrane normal.     Nose: Nose  normal.     Mouth/Throat:     Mouth: Mucous membranes are moist.     Pharynx: Oropharynx is clear.  Cardiovascular:     Rate and Rhythm: Normal rate and regular rhythm.  Pulmonary:     Effort: Pulmonary effort is normal.     Breath sounds: Normal breath sounds.  Musculoskeletal:     Cervical back: Neck supple.  Lymphadenopathy:     Cervical: No cervical adenopathy.  Neurological:     Mental Status: He is alert.    BP 134/80 (BP Location: Left Arm, Patient Position: Sitting, Cuff Size: Normal)   Pulse 95   Temp 97.9 F (36.6 C) (Oral)   Wt (!) 330 lb 8 oz (149.9 kg)   SpO2 97%   BMI 44.82 kg/m  Wt Readings from Last 3 Encounters:  01/09/21 (!) 330 lb 8 oz (149.9 kg)  10/10/20 (!) 327 lb (148.3 kg)  10/01/20 (!) 327 lb (148.3 kg)     Health Maintenance Due  Topic Date Due   COVID-19 Vaccine (4 - Booster for Pfizer series) 02/08/2020   TETANUS/TDAP  08/07/2020   INFLUENZA VACCINE  Never done    There are no preventive care reminders to display for this patient.  No results found for: TSH Lab Results  Component Value Date   WBC 7.7 10/01/2020   HGB 14.3 10/01/2020   HCT 41.4 10/01/2020   MCV 83.1 10/01/2020   PLT 202.0 10/01/2020   Lab Results  Component Value Date   NA 138 10/01/2020   K 4.2 10/01/2020   CO2 26 10/01/2020   GLUCOSE 84 10/01/2020   BUN 14 10/01/2020   CREATININE 1.15 10/01/2020   BILITOT 0.6 10/01/2020   ALKPHOS 45 10/01/2020   AST 23 10/01/2020   ALT 32 10/01/2020   PROT 7.2 10/01/2020   ALBUMIN 4.5 10/01/2020   CALCIUM 9.2 10/01/2020   GFR 81.61 10/01/2020   No results found for: CHOL No results found for: HDL No results found for: LDLCALC No results found for: TRIG No results found for: CHOLHDL No results found for: BCWU8Q    Assessment & Plan:   Probable acute right frontal sinusitis.  Recent right frontal pain different than his migraine headaches.  No evidence for preseptal or periorbital cellulitis.  Extraocular  movements all normal.  Patient improving already with just couple of doses of Augmentin  -Complete full 10-day course of Augmentin 875 mg twice daily with food for 10 days -Follow-up for any  persistent or worsening symptoms  Meds ordered this encounter  Medications   amoxicillin-clavulanate (AUGMENTIN) 875-125 MG tablet    Sig: Take 1 tablet by mouth 2 (two) times daily.    Dispense:  20 tablet    Refill:  0    Follow-up: No follow-ups on file.    Evelena Peat, MD

## 2021-01-24 ENCOUNTER — Other Ambulatory Visit: Payer: Self-pay | Admitting: Family Medicine

## 2021-01-27 ENCOUNTER — Ambulatory Visit: Payer: Managed Care, Other (non HMO) | Admitting: Adult Health

## 2021-01-27 DIAGNOSIS — B9789 Other viral agents as the cause of diseases classified elsewhere: Secondary | ICD-10-CM | POA: Diagnosis not present

## 2021-01-27 DIAGNOSIS — J988 Other specified respiratory disorders: Secondary | ICD-10-CM

## 2021-01-27 LAB — POCT INFLUENZA A/B
Influenza A, POC: NEGATIVE
Influenza B, POC: NEGATIVE

## 2021-01-27 LAB — POC COVID19 BINAXNOW: SARS Coronavirus 2 Ag: NEGATIVE

## 2021-01-27 MED ORDER — PREDNISONE 10 MG PO TABS: ORAL_TABLET | ORAL | 0 refills | Status: DC

## 2021-01-27 MED ORDER — HYDROCODONE BIT-HOMATROP MBR 5-1.5 MG/5ML PO SOLN: 5.0000 mL | Freq: Three times a day (TID) | ORAL | 0 refills | Status: DC | PRN

## 2021-01-27 NOTE — Progress Notes (Signed)
Subjective:    Patient ID: Brian Lam, male    DOB: 08-19-1985, 35 y.o.   MRN: [id number]  HPI 35 year old male who  has a past medical history of Chronic knee pain, Contact dermatitis, Depression, GERD (gastroesophageal reflux disease), and Migraines.  He presents to the office today for an acute issue. His symptoms started about 4 days ago. His symptoms include sore throat, ear pain, headache, fatigue, muscle ache, sinus congestion ( improving) and harsh dry cough. Symptom started soon after finishing Augmentin for sinus infection   He did test at home and had a negative Covid 19    Review of Systems See HPI   Past Medical History:  Diagnosis Date   Chronic knee pain    Contact dermatitis    Depression    GERD (gastroesophageal reflux disease)    Migraines     Social History   Socioeconomic History   Marital status: Married    Spouse name: Not on file   Number of children: Not on file   Years of education: Not on file   Highest education level: Not on file  Occupational History   Not on file  Tobacco Use   Smoking status: Never   Smokeless tobacco: Never  Vaping Use   Vaping Use: Never used  Substance and Sexual Activity   Alcohol use: Yes    Comment: 2 to 3 drinks a week   Drug use: No   Sexual activity: Not on file  Other Topics Concern   Not on file  Social History Narrative   Not on file   Social Determinants of Health   Financial Resource Strain: Not on file  Food Insecurity: Not on file  Transportation Needs: Not on file  Physical Activity: Not on file  Stress: Not on file  Social Connections: Not on file  Intimate Partner Violence: Not on file    Past Surgical History:  Procedure Laterality Date   VASECTOMY  2020    Family History  Problem Relation Age of Onset   Diabetes type II Father    Heart attack Maternal Grandfather    Diabetes Maternal Grandfather    Colon cancer Neg Hx    Esophageal cancer Neg Hx    Stomach cancer Neg Hx     Rectal cancer Neg Hx     No Known Allergies  Current Outpatient Medications on File Prior to Visit  Medication Sig Dispense Refill   albuterol (VENTOLIN HFA) 108 (90 Base) MCG/ACT inhaler      desoximetasone (TOPICORT) 0.25 % cream Apply 1 application topically 2 (two) times daily. 30 g 2   famotidine (PEPCID) 20 MG tablet Take 20 mg by mouth at bedtime.     FLUoxetine (PROZAC) 20 MG tablet TAKE ONE TABLET BY MOUTH DAILY 30 tablet 5   omeprazole (PRILOSEC) 20 MG capsule Take by mouth daily.     propranolol ER (INDERAL LA) 120 MG 24 hr capsule Take 1 capsule (120 mg total) by mouth daily. 30 capsule 11   rizatriptan (MAXALT-MLT) 10 MG disintegrating tablet Take 1 tablet (10 mg total) by mouth as needed for migraine. May repeat in 2 hours if needed 10 tablet 5   No current facility-administered medications on file prior to visit.    There were no vitals taken for this visit.      Objective:   Physical Exam Vitals and nursing note reviewed.  Constitutional:      Appearance: Normal appearance.  HENT:  Nose: Nose normal. No congestion or rhinorrhea.     Mouth/Throat:     Mouth: Mucous membranes are moist.     Pharynx: Oropharynx is clear. No oropharyngeal exudate or posterior oropharyngeal erythema.  Cardiovascular:     Rate and Rhythm: Normal rate and regular rhythm.     Pulses: Normal pulses.     Heart sounds: Normal heart sounds.  Musculoskeletal:        General: Normal range of motion.  Skin:    General: Skin is warm and dry.  Neurological:     General: No focal deficit present.     Mental Status: He is alert and oriented to person, place, and time.  Psychiatric:        Mood and Affect: Mood normal.        Behavior: Behavior normal.        Thought Content: Thought content normal.        Judgment: Judgment normal.      Assessment & Plan:  1. Viral respiratory infection -Lungs clear throughout.  Seems to be more viral.  We will send in prednisone and Hycodan  cough syrup.  Advise follow-up if symptoms not improving in the next 2 to 3 days - predniSONE (DELTASONE) 10 MG tablet; 40 mg x 3 days, 20 mg x 3 days, 10 mg x 3 days  Dispense: 21 tablet; Refill: 0 - POC COVID-19- negative  - POC Influenza A/B- negative  - HYDROcodone bit-homatropine (HYCODAN) 5-1.5 MG/5ML syrup; Take 5 mLs by mouth every 8 (eight) hours as needed for cough.  Dispense: 120 mL; Refill: 0   Shirline Frees, NP

## 2021-03-03 ENCOUNTER — Telehealth: Payer: Managed Care, Other (non HMO) | Admitting: Family Medicine

## 2021-05-02 ENCOUNTER — Other Ambulatory Visit: Payer: Self-pay | Admitting: Family Medicine

## 2021-06-12 ENCOUNTER — Encounter: Payer: Self-pay | Admitting: Family Medicine

## 2021-06-12 ENCOUNTER — Ambulatory Visit (INDEPENDENT_AMBULATORY_CARE_PROVIDER_SITE_OTHER): Payer: Managed Care, Other (non HMO) | Admitting: Family Medicine

## 2021-06-12 VITALS — BP 122/76 | HR 91 | Temp 98.2°F | Ht 72.0 in | Wt 335.0 lb

## 2021-06-12 DIAGNOSIS — Z23 Encounter for immunization: Secondary | ICD-10-CM | POA: Diagnosis not present

## 2021-06-12 DIAGNOSIS — Z0000 Encounter for general adult medical examination without abnormal findings: Secondary | ICD-10-CM

## 2021-06-12 LAB — LIPID PANEL
Cholesterol: 164 mg/dL (ref 0–200)
HDL: 35.3 mg/dL — ABNORMAL LOW (ref >39.00)
LDL Cholesterol: 95 mg/dL (ref 0–99)
NonHDL: 128.65
Total CHOL/HDL Ratio: 5
Triglycerides: 170 mg/dL — ABNORMAL HIGH (ref 0.0–149.0)
VLDL: 34 mg/dL (ref 0.0–40.0)

## 2021-06-12 LAB — CBC WITH DIFFERENTIAL/PLATELET
Basophils Absolute: 0 10*3/uL (ref 0.0–0.1)
Basophils Relative: 0.5 % (ref 0.0–3.0)
Eosinophils Absolute: 0.3 10*3/uL (ref 0.0–0.7)
Eosinophils Relative: 4 % (ref 0.0–5.0)
HCT: 42 % (ref 39.0–52.0)
Hemoglobin: 14.2 g/dL (ref 13.0–17.0)
Lymphocytes Relative: 36.7 % (ref 12.0–46.0)
Lymphs Abs: 2.7 10*3/uL (ref 0.7–4.0)
MCHC: 33.8 g/dL (ref 30.0–36.0)
MCV: 83.5 fl (ref 78.0–100.0)
Monocytes Absolute: 0.5 10*3/uL (ref 0.1–1.0)
Monocytes Relative: 6.7 % (ref 3.0–12.0)
Neutro Abs: 3.9 10*3/uL (ref 1.4–7.7)
Neutrophils Relative %: 52.1 % (ref 43.0–77.0)
Platelets: 189 10*3/uL (ref 150.0–400.0)
RBC: 5.03 Mil/uL (ref 4.22–5.81)
RDW: 13.6 % (ref 11.5–15.5)
WBC: 7.5 10*3/uL (ref 4.0–10.5)

## 2021-06-12 LAB — HEPATIC FUNCTION PANEL
ALT: 32 U/L (ref 0–53)
AST: 23 U/L (ref 0–37)
Albumin: 4.6 g/dL (ref 3.5–5.2)
Alkaline Phosphatase: 42 U/L (ref 39–117)
Bilirubin, Direct: 0.1 mg/dL (ref 0.0–0.3)
Total Bilirubin: 0.6 mg/dL (ref 0.2–1.2)
Total Protein: 7.3 g/dL (ref 6.0–8.3)

## 2021-06-12 LAB — BASIC METABOLIC PANEL
BUN: 14 mg/dL (ref 6–23)
CO2: 28 mEq/L (ref 19–32)
Calcium: 9.4 mg/dL (ref 8.4–10.5)
Chloride: 105 mEq/L (ref 96–112)
Creatinine, Ser: 1.13 mg/dL (ref 0.40–1.50)
GFR: 82.94 mL/min (ref >60.00)
Glucose, Bld: 92 mg/dL (ref 70–99)
Potassium: 4.4 mEq/L (ref 3.5–5.1)
Sodium: 141 mEq/L (ref 135–145)

## 2021-06-12 LAB — HEMOGLOBIN A1C: Hgb A1c MFr Bld: 5.9 % (ref 4.6–6.5)

## 2021-06-12 LAB — TSH: TSH: 2.35 u[IU]/mL (ref 0.35–5.50)

## 2021-06-12 NOTE — Progress Notes (Signed)
? ?Established Patient Office Visit ? ?Subjective:  ?Patient ID: Brian Lam, male    DOB: 1985-07-28  Age: 36 y.o. MRN: [id number] ? ?CC:  ?Chief Complaint  ?Patient presents with  ? Annual Exam  ? ? ?HPI ?Brian Lam presents for physical exam.  He has history of migraine headaches and history of frequent GERD.  Currently battling respiratory symptoms.  [facility]  Several members of his family tested positive for strep.  He had not been placed on amoxicillin because of some left ear issues.  No fever at this time.  Still has some congestion. ? ?Health maintenance reviewed ? ?-Last tetanus over 10 years ago ?He does receive annual flu vaccine ? ?Family history reviewed-both parents are alive.  His father apparently has type 2 diabetes.  Also maternal grandfather with type 2 diabetes.  He states his mother had multiple surgeries but he is not sure of any specific medical disorder.  He has a brother who is alive and well. ? ?Social history Works as a homicide Scientist, physiological.  He is married and has 3 children ages 39, 71, and 2.  Never smoked.  No regular alcohol use. ? ?Wt Readings from Last 3 Encounters:  ?06/12/21 (!) 335 lb (152 kg)  ?01/09/21 (!) 330 lb 8 oz (149.9 kg)  ?10/10/20 (!) 327 lb (148.3 kg)  ? ? ? ?Past Medical History:  ?Diagnosis Date  ? Chronic knee pain   ? Contact dermatitis   ? Depression   ? GERD (gastroesophageal reflux disease)   ? Migraines   ? ? ?Past Surgical History:  ?Procedure Laterality Date  ? VASECTOMY  2020  ? ? ?Family History  ?Problem Relation Age of Onset  ? Diabetes Father   ? Diabetes type II Father   ? Heart attack Maternal Grandfather   ? Diabetes Maternal Grandfather   ? Colon cancer Neg Hx   ? Esophageal cancer Neg Hx   ? Stomach cancer Neg Hx   ? Rectal cancer Neg Hx   ? ? ?Social History  ? ?Socioeconomic History  ? Marital status: Married  ?  Spouse name: Not on file  ? Number of children: Not on file  ? Years of education:  Not on file  ? Highest education level: Not on file  ?Occupational History  ? Not on file  ?Tobacco Use  ? Smoking status: Never  ? Smokeless tobacco: Never  ?Vaping Use  ? Vaping Use: Never used  ?Substance and Sexual Activity  ? Alcohol use: Yes  ?  Comment: 2 to 3 drinks a week  ? Drug use: No  ? Sexual activity: Not on file  ?Other Topics Concern  ? Not on file  ?Social History Narrative  ? Not on file  ? ?Social Determinants of Health  ? ?Financial Resource Strain: Not on file  ?Food Insecurity: Not on file  ?Transportation Needs: Not on file  ?Physical Activity: Not on file  ?Stress: Not on file  ?Social Connections: Not on file  ?Intimate Partner Violence: Not on file  ? ? ?Outpatient Medications Prior to Visit  ?Medication Sig Dispense Refill  ? albuterol (VENTOLIN HFA) 108 (90 Base) MCG/ACT inhaler     ? desoximetasone (TOPICORT) 0.25 % cream Apply 1 application topically 2 (two) times daily. 30 g 2  ? famotidine (PEPCID) 20 MG tablet Take 20 mg by mouth at bedtime.    ? FLUoxetine (PROZAC) 20 MG tablet TAKE ONE TABLET BY  MOUTH DAILY 30 tablet 5  ? HYDROcodone bit-homatropine (HYCODAN) 5-1.5 MG/5ML syrup Take 5 mLs by mouth every 8 (eight) hours as needed for cough. 120 mL 0  ? omeprazole (PRILOSEC) 20 MG capsule Take by mouth daily.    ? predniSONE (DELTASONE) 10 MG tablet 40 mg x 3 days, 20 mg x 3 days, 10 mg x 3 days 21 tablet 0  ? propranolol ER (INDERAL LA) 120 MG 24 hr capsule TAKE ONE CAPSULE BY MOUTH DAILY 30 capsule 11  ? rizatriptan (MAXALT-MLT) 10 MG disintegrating tablet Take 1 tablet (10 mg total) by mouth as needed for migraine. May repeat in 2 hours if needed 10 tablet 5  ? ?No facility-administered medications prior to visit.  ? ? ?No Known Allergies ? ?ROS ?Review of Systems  ?Constitutional:  Negative for activity change, appetite change, fatigue and fever.  ?HENT:  Negative for congestion, ear pain and trouble swallowing.   ?Eyes:  Negative for pain and visual disturbance.  ?Respiratory:   Negative for cough, shortness of breath and wheezing.   ?Cardiovascular:  Negative for chest pain and palpitations.  ?Gastrointestinal:  Negative for abdominal distention, abdominal pain, blood in stool, constipation, diarrhea, nausea, rectal pain and vomiting.  ?Genitourinary:  Negative for dysuria, hematuria and testicular pain.  ?Musculoskeletal:  Negative for arthralgias and joint swelling.  ?Skin:  Negative for rash.  ?Neurological:  Negative for dizziness, syncope and headaches.  ?Hematological:  Negative for adenopathy.  ?Psychiatric/Behavioral:  Negative for confusion and dysphoric mood.   ? ?  ?Objective:  ?  ?Physical Exam ?Constitutional:   ?   General: He is not in acute distress. ?   Appearance: He is well-developed.  ?HENT:  ?   Head: Normocephalic and atraumatic.  ?   Right Ear: External ear normal.  ?   Left Ear: External ear normal.  ?Eyes:  ?   Conjunctiva/sclera: Conjunctivae normal.  ?   Pupils: Pupils are equal, round, and reactive to light.  ?Neck:  ?   Thyroid: No thyromegaly.  ?Cardiovascular:  ?   Rate and Rhythm: Normal rate and regular rhythm.  ?   Heart sounds: Normal heart sounds. No murmur heard. ?Pulmonary:  ?   Effort: No respiratory distress.  ?   Breath sounds: No wheezing or rales.  ?Abdominal:  ?   General: Bowel sounds are normal. There is no distension.  ?   Palpations: Abdomen is soft. There is no mass.  ?   Tenderness: There is no abdominal tenderness. There is no guarding or rebound.  ?Musculoskeletal:  ?   Cervical back: Normal range of motion and neck supple.  ?   Right lower leg: No edema.  ?   Left lower leg: No edema.  ?Lymphadenopathy:  ?   Cervical: No cervical adenopathy.  ?Skin: ?   Findings: No rash.  ?Neurological:  ?   Mental Status: He is alert and oriented to person, place, and time.  ?   Cranial Nerves: No cranial nerve deficit.  ?Psychiatric:     ?   Mood and Affect: Mood normal.  ? ? ?BP 122/76 (BP Location: Left Arm, Patient Position: Sitting, Cuff Size:  Normal)   Pulse 91   Temp 98.2 ?F (36.8 ?C) (Oral)   Ht 6' (1.829 m)   Wt (!) 335 lb (152 kg)   SpO2 96%   BMI 45.43 kg/m?  ?Wt Readings from Last 3 Encounters:  ?06/12/21 (!) 335 lb (152 kg)  ?01/09/21 (!) 330 lb  8 oz (149.9 kg)  ?10/10/20 (!) 327 lb (148.3 kg)  ? ? ? ?Health Maintenance Due  ?Topic Date Due  ? TETANUS/TDAP  08/07/2020  ? ? ?There are no preventive care reminders to display for this patient. ? ?No results found for: TSH ?Lab Results  ?Component Value Date  ? WBC 7.7 10/01/2020  ? HGB 14.3 10/01/2020  ? HCT 41.4 10/01/2020  ? MCV 83.1 10/01/2020  ? PLT 202.0 10/01/2020  ? ?Lab Results  ?Component Value Date  ? NA 138 10/01/2020  ? K 4.2 10/01/2020  ? CO2 26 10/01/2020  ? GLUCOSE 84 10/01/2020  ? BUN 14 10/01/2020  ? CREATININE 1.15 10/01/2020  ? BILITOT 0.6 10/01/2020  ? ALKPHOS 45 10/01/2020  ? AST 23 10/01/2020  ? ALT 32 10/01/2020  ? PROT 7.2 10/01/2020  ? ALBUMIN 4.5 10/01/2020  ? CALCIUM 9.2 10/01/2020  ? GFR 81.61 10/01/2020  ? ?No results found for: CHOL ?No results found for: HDL ?No results found for: LDLCALC ?No results found for: TRIG ?No results found for: CHOLHDL ?No results found for: HGBA1C ? ?  ?Assessment & Plan:  ? ?Problem List Items Addressed This Visit   ?None ?Visit Diagnoses   ? ? Physical exam    -  Primary  ? Relevant Orders  ? Basic metabolic panel  ? Lipid panel  ? CBC with Differential/Platelet  ? TSH  ? Hepatic function panel  ? Hemoglobin A1c  ? ?  ?He has had some weight gain in recent years.  We have challenged him to lose some weight.  Get tetanus booster.  Obtain screening labs as above.  Establish more consistent exercise. ? ?No orders of the defined types were placed in this encounter. ? ? ?Follow-up: No follow-ups on file.  ? ? ?Evelena PeatBruce Ethyn Schetter, MD ?

## 2021-06-28 ENCOUNTER — Encounter: Payer: Self-pay | Admitting: Family Medicine

## 2021-06-29 MED ORDER — OZEMPIC (0.25 OR 0.5 MG/DOSE) 2 MG/1.5ML ~~LOC~~ SOPN: PEN_INJECTOR | SUBCUTANEOUS | 1 refills | Status: DC

## 2021-06-29 NOTE — Telephone Encounter (Signed)
I sent in the Ozempic to start 0.25 mg [place] once weekly and set up follow up in about one month to discuss further titration and reassess.  Nausea would be most common side effect to look for.   ?

## 2021-06-30 ENCOUNTER — Telehealth: Payer: Self-pay | Admitting: Family Medicine

## 2021-06-30 MED ORDER — OZEMPIC (0.25 OR 0.5 MG/DOSE) 2 MG/1.5ML ~~LOC~~ SOPN: PEN_INJECTOR | SUBCUTANEOUS | 1 refills | Status: DC

## 2021-06-30 NOTE — Telephone Encounter (Signed)
Requesting prescription for  Semaglutide,0.25 or 0.5MG /DOS, (OZEMPIC, 0.25 OR 0.5 MG/DOSE,) 2 MG/1.5ML SOPN be sent to [facility] pharmacy [address]  [phone] ?

## 2021-06-30 NOTE — Telephone Encounter (Signed)
Rx sent 

## 2021-07-30 ENCOUNTER — Other Ambulatory Visit: Payer: Self-pay | Admitting: Family Medicine

## 2021-08-05 ENCOUNTER — Ambulatory Visit (INDEPENDENT_AMBULATORY_CARE_PROVIDER_SITE_OTHER): Payer: Managed Care, Other (non HMO) | Admitting: Family Medicine

## 2021-08-05 ENCOUNTER — Encounter: Payer: Self-pay | Admitting: Family Medicine

## 2021-08-05 MED ORDER — OZEMPIC (0.25 OR 0.5 MG/DOSE) 2 MG/1.5ML ~~LOC~~ SOPN: 0.5000 mg | PEN_INJECTOR | SUBCUTANEOUS | 1 refills | Status: DC

## 2021-08-05 NOTE — Patient Instructions (Signed)
Let's plan to bump the Ozempic in approximately one month to the 1 mg dose  Let me know if tolerating well.

## 2021-08-05 NOTE — Progress Notes (Signed)
Established Patient Office Visit  Subjective   Patient ID: Brian Lam, male    DOB: [birth year]  Age: 36 y.o. MRN: [id number]  Chief Complaint  Patient presents with   Follow-up    HPI   Brian Lam is here to follow-up regarding recent initiation of Ozempic.  He has morbid obesity with BMI over 40.  He also had prediabetes range blood sugars with A1c 5.9%.  We started Ozempic 0.25 mg subcutaneous once weekly.  He had some mild nausea initially but is tolerating well with essentially no nausea.  Just recently went up to 0.5 mg dosage.  Has lost about 9 pounds by his scales.  He is very pleased thus far.  No other adverse side effects.  Past Medical History:  Diagnosis Date   Chronic knee pain    Contact dermatitis    Depression    GERD (gastroesophageal reflux disease)    Migraines    Past Surgical History:  Procedure Laterality Date   VASECTOMY  2020    reports that he has never smoked. He has never used smokeless tobacco. He reports current alcohol use. He reports that he does not use drugs. family history includes Diabetes in his father and maternal grandfather; Diabetes type II in his father; Heart attack in his maternal grandfather. No Known Allergies  Review of Systems  Gastrointestinal:  Negative for abdominal pain, nausea and vomiting.     Objective:     BP 110/76 (BP Location: Left Arm, Patient Position: Sitting, Cuff Size: Large)   Pulse 88   Temp 97.7 F (36.5 C) (Oral)   Ht 6' (1.829 m)   Wt (!) 330 lb 4.8 oz (149.8 kg)   SpO2 98%   BMI 44.80 kg/m  BP Readings from Last 3 Encounters:  08/05/21 110/76  06/12/21 122/76  01/09/21 134/80   Wt Readings from Last 3 Encounters:  08/05/21 (!) 330 lb 4.8 oz (149.8 kg)  06/12/21 (!) 335 lb (152 kg)  01/09/21 (!) 330 lb 8 oz (149.9 kg)      Physical Exam Cardiovascular:     Rate and Rhythm: Normal rate and regular rhythm.  Pulmonary:     Effort: Pulmonary effort is normal.     Breath sounds: Normal breath  sounds.  Neurological:     Mental Status: He is alert.     No results found for any visits on 08/05/21.  Last CBC Lab Results  Component Value Date   WBC 7.5 06/12/2021   HGB 14.2 06/12/2021   HCT 42.0 06/12/2021   MCV 83.5 06/12/2021   RDW 13.6 06/12/2021   PLT 189.0 06/12/2021   Last metabolic panel Lab Results  Component Value Date   GLUCOSE 92 06/12/2021   NA 141 06/12/2021   K 4.4 06/12/2021   CL 105 06/12/2021   CO2 28 06/12/2021   BUN 14 06/12/2021   CREATININE 1.13 06/12/2021   CALCIUM 9.4 06/12/2021   PROT 7.3 06/12/2021   ALBUMIN 4.6 06/12/2021   BILITOT 0.6 06/12/2021   ALKPHOS 42 06/12/2021   AST 23 06/12/2021   ALT 32 06/12/2021   Last lipids Lab Results  Component Value Date   CHOL 164 06/12/2021   HDL 35.30 (L) 06/12/2021   LDLCALC 95 06/12/2021   TRIG 170.0 (H) 06/12/2021   CHOLHDL 5 06/12/2021   Last hemoglobin A1c Lab Results  Component Value Date   HGBA1C 5.9 06/12/2021   Last thyroid functions Lab Results  Component Value Date   TSH 2.35 06/12/2021  The ASCVD Risk score (Arnett DK, et al., 2019) failed to calculate for the following reasons:   The 2019 ASCVD risk score is only valid for ages 67 to 87    Assessment & Plan:   Problem List Items Addressed This Visit       Unprioritized   Morbid obesity (HCC)   Relevant Medications   Semaglutide,0.25 or 0.5MG /DOS, (OZEMPIC, 0.25 OR 0.5 MG/DOSE,) 2 MG/1.5ML SOPN  Recent initiation of Ozempic and tolerating well currently 0.5 mg dosage.  He just started on this.  We recommend at least another month on 0.5 mg subcutaneous once weekly and if tolerating well that point further titrate to 1 mg at that time.  Recommend regular exercise and set up 24-month follow-up to reassess.  He would like to eventually get his weight down to goal of 250 pounds.  He does have a fairly large frame but this goal seems very reasonable.  Return in about 3 months (around 11/05/2021).    Evelena Peat, MD

## 2021-08-07 ENCOUNTER — Encounter: Payer: Self-pay | Admitting: Family Medicine

## 2021-08-07 NOTE — Telephone Encounter (Signed)
Noted  

## 2021-08-11 ENCOUNTER — Telehealth: Payer: Self-pay | Admitting: Family Medicine

## 2021-08-11 MED ORDER — OZEMPIC (0.25 OR 0.5 MG/DOSE) 2 MG/1.5ML ~~LOC~~ SOPN: 0.5000 mg | PEN_INJECTOR | SUBCUTANEOUS | 1 refills | Status: DC

## 2021-08-11 NOTE — Telephone Encounter (Signed)
The medication Semaglutide,0.25 or 0.5MG /DOS, (OZEMPIC, 0.25 OR 0.5 MG/DOSE,) 2 MG/1.5ML SOPN was sent to wrong pharm please send to  [facility] [address] - [place], [place] - [address] Phone:  [phone]  Fax:  [phone]

## 2021-08-11 NOTE — Telephone Encounter (Signed)
Lelon Mast from [facility] call and stated she need a call back about a RX for Ozempic her # is [phone].

## 2021-08-11 NOTE — Telephone Encounter (Signed)
I spoke with Lelon Mast and she was inquiring if it was ok to changew package size to 32ml instead of 1.53ml. The 1.61ml has been discontinued.

## 2021-08-11 NOTE — Telephone Encounter (Signed)
Rx sent 

## 2021-08-12 MED ORDER — OZEMPIC (0.25 OR 0.5 MG/DOSE) 2 MG/1.5ML ~~LOC~~ SOPN: 0.5000 mg | PEN_INJECTOR | SUBCUTANEOUS | 1 refills | Status: DC

## 2021-08-12 NOTE — Telephone Encounter (Signed)
Rx sent 

## 2021-08-25 ENCOUNTER — Encounter: Payer: Self-pay | Admitting: Family Medicine

## 2021-08-31 ENCOUNTER — Telehealth (INDEPENDENT_AMBULATORY_CARE_PROVIDER_SITE_OTHER): Payer: Managed Care, Other (non HMO) | Admitting: Family Medicine

## 2021-08-31 ENCOUNTER — Encounter: Payer: Self-pay | Admitting: Family Medicine

## 2021-08-31 VITALS — Ht 72.0 in | Wt 305.0 lb

## 2021-08-31 DIAGNOSIS — A084 Viral intestinal infection, unspecified: Secondary | ICD-10-CM

## 2021-08-31 NOTE — Progress Notes (Signed)
Patient ID: Brian Lam, male   DOB: [birth year], 36 y.o.   MRN: [id number]   Virtual Visit via Video Note  I connected with Myna Hidalgo on 08/31/21 at  4:30 PM EDT by a video enabled telemedicine application and verified that I am speaking with the correct person using two identifiers.  Location patient: home Location provider:work or home office Persons participating in the virtual visit: patient, provider  I discussed the limitations of evaluation and management by telemedicine and the availability of in person appointments. The patient expressed understanding and agreed to proceed.   HPI:  Brian Lam called with recent presumed gastroenteritis.  He had some nausea and vomiting initially followed by diarrhea up to every 30 minutes for several days.  In fact, his diarrhea lasted for about a week.  He had some initial chills.  Several other family members had similar symptoms but theirs was much briefer.  Brian Lam has had a tendency to have prolonged diarrhea in the past.  He suspects he may have some IBS.  He has discussed with his gastroenterologist in the past.  He denies any recent antibiotics.  No known family history of inflammatory bowel disease.  His appetite has improved and he is keeping down fluids and eating better now.  He does have questions regarding probiotics  He did recently go on Ozempic for weight loss.  Was up to 0.5 mg but was instructed to hold this last week with his GI illness.  He hopes to start back the Ozempic later this week.   ROS: See pertinent positives and negatives per HPI.  Past Medical History:  Diagnosis Date   Chronic knee pain    Contact dermatitis    Depression    GERD (gastroesophageal reflux disease)    Migraines     Past Surgical History:  Procedure Laterality Date   VASECTOMY  2020    Family History  Problem Relation Age of Onset   Diabetes Father    Diabetes type II Father    Heart attack Maternal Grandfather    Diabetes Maternal Grandfather     Colon cancer Neg Hx    Esophageal cancer Neg Hx    Stomach cancer Neg Hx    Rectal cancer Neg Hx     SOCIAL HX: Non-smoker.   Current Outpatient Medications:    albuterol (VENTOLIN HFA) 108 (90 Base) MCG/ACT inhaler, , Disp: , Rfl:    desoximetasone (TOPICORT) 0.25 % cream, Apply 1 application topically 2 (two) times daily., Disp: 30 g, Rfl: 2   famotidine (PEPCID) 20 MG tablet, Take 20 mg by mouth at bedtime., Disp: , Rfl:    FLUoxetine (PROZAC) 20 MG tablet, TAKE ONE TABLET BY MOUTH DAILY, Disp: 30 tablet, Rfl: 5   omeprazole (PRILOSEC) 20 MG capsule, Take by mouth daily., Disp: , Rfl:    propranolol ER (INDERAL LA) 120 MG 24 hr capsule, TAKE ONE CAPSULE BY MOUTH DAILY, Disp: 30 capsule, Rfl: 11   rizatriptan (MAXALT-MLT) 10 MG disintegrating tablet, Take 1 tablet (10 mg total) by mouth as needed for migraine. May repeat in 2 hours if needed, Disp: 10 tablet, Rfl: 5   Semaglutide,0.25 or 0.5MG /DOS, (OZEMPIC, 0.25 OR 0.5 MG/DOSE,) 2 MG/1.5ML SOPN, Inject 0.5 mg into the skin once a week., Disp: 3 mL, Rfl: 1  EXAM:  VITALS per patient if applicable:  GENERAL: alert, oriented, appears well and in no acute distress  HEENT: atraumatic, conjunttiva clear, no obvious abnormalities on inspection of external nose and ears  NECK:  normal movements of the head and neck  LUNGS: on inspection no signs of respiratory distress, breathing rate appears normal, no obvious gross SOB, gasping or wheezing  CV: no obvious cyanosis  MS: moves all visible extremities without noticeable abnormality  PSYCH/NEURO: pleasant and cooperative, no obvious depression or anxiety, speech and thought processing grossly intact  ASSESSMENT AND PLAN:  Discussed the following assessment and plan:   Probable gastroenteritis.  Symptoms finally resolved.  Question of underlying IBS history.  He had questions today regarding whether he should get colonoscopy.  He does not have any family history of colon cancer.   He has pending follow-up with GI in July and will discuss with them -We did suggest possible probiotic such as Activia -He will resume Ozempic later this week    I discussed the assessment and treatment plan with the patient. The patient was provided an opportunity to ask questions and all were answered. The patient agreed with the plan and demonstrated an understanding of the instructions.   The patient was advised to call back or seek an in-person evaluation if the symptoms worsen or if the condition fails to improve as anticipated.     Evelena Peat, MD

## 2021-09-21 NOTE — Progress Notes (Unsigned)
09/22/2021 Brian Lam [id number] 1986/02/11  Referring provider: [name], MD Primary GI doctor: Dr. 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  We have discussed the risks of bleeding, infection, perforation, medication reactions, and remote risk of death associated with colonoscopy. All questions were answered and the patient acknowledges these risk and wishes to proceed. If negative consider xifaxin for IBS-D Given FODMAP diet Weight loss discussed.  -     CBC with Differential/Platelet; Future -     Comprehensive metabolic panel; Future -     High sensitivity CRP; Future -     Sedimentation rate; Future  Morbid obesity  Body mass index is 42.09 kg/m.  -Patient has been advised to make an attempt to improve diet and exercise patterns to aid in weight loss. -Recommended diet heavy in fruits and veggies and low in animal meats, cheeses, and dairy products, appropriate calorie intake  GERD he reports symptoms are currently well controlled. Lifestyle changes discussed, avoid NSAIDS Continue current medications   History of Present Illness:  36 y.o. male  with a past medical history of morbid obesity, migraines, reflux and others listed below, returns to clinic today for evaluation of abdominal pain.  10/10/2020 EGD with Dr. Myrtie Neither for longstanding history of GERD normal stomach, duodenum multiple Biopsies unremarkable Negative celiac, normal sed rate.  Had 4-6 bowel movements per day no urgency, no blood, not evaluated further.  The last 6 months he has had 3 episodes of diarrhea, with urgency, could be up to 12 times a day, last episode was 4 weeks.  Episodes will last 8-9 days.  Lost 13 lbs in  a few days.  He would have upper AB pain, no improvement with BM.  Would have nocturnal diarrhea.  He was having some chills, fatigue but denies fever.  Had nausea and one episode of vomiting, had increase gastritis/GERD with burping.  Denies blood in the stool.  Has started on [place] with some help.  Has no AB bloating bloating.  Outside of episodes can have 4-6 BM's a day without urgency/AB pain.  Mom with autoimmune thyroiditis.  Patient is interested in getting colonoscopy.  Has been on ozempic since May/April on 0.5mg  no new medications.   CBC  06/12/2021  HGB 14.2 MCV 83.5 without evidence of anemia WBC 7.5 Platelets 189.0 Kidney function 06/12/2021  BUN 14 Cr 1.13  Potassium 4.4   LFTs 06/12/2021  AST 23 ALT 32 Alkphos 42 TBili 0.6 06/12/2021 TSH 2.35  Wt Readings from Last 3 Encounters:  09/22/21 (!) 319 lb (144.7 kg)  08/31/21 (!) 305 lb (138.3 kg)  08/05/21 (!) 330 lb 4.8 oz (149.8 kg)    Current Medications:   Current Outpatient Medications (Endocrine & Metabolic):    Semaglutide,0.25 or 0.5MG /DOS, (OZEMPIC, 0.25 OR 0.5 MG/DOSE,) 2 MG/1.5ML SOPN, Inject 0.5 mg into the skin once a week.  Current Outpatient Medications (Cardiovascular):    propranolol ER (INDERAL LA) 120 MG 24 hr capsule, TAKE ONE CAPSULE BY MOUTH DAILY  Current Outpatient Medications (Respiratory):    albuterol (VENTOLIN HFA) 108 (90 Base) MCG/ACT inhaler,   Current Outpatient Medications (Analgesics):    rizatriptan (MAXALT-MLT) 10 MG disintegrating tablet, Take 1 tablet (10 mg total) by mouth  as needed for migraine. May repeat in 2 hours if needed   Current Outpatient Medications (Other):    desoximetasone (TOPICORT) 0.25 % cream, Apply 1 application topically 2 (two) times daily.   famotidine (PEPCID) 20 MG tablet, Take 20 mg by mouth at bedtime.   FLUoxetine (PROZAC) 20 MG tablet, TAKE ONE TABLET BY MOUTH DAILY   omeprazole (PRILOSEC) 20 MG capsule, Take by mouth daily.  Surgical  History:  He  has a past surgical history that includes Vasectomy (2020). Family History:  His family history includes Diabetes in his father and maternal grandfather; Diabetes type II in his father; Heart attack in his maternal grandfather. Social History:   reports that he has never smoked. He has never used smokeless tobacco. He reports current alcohol use. He reports that he does not use drugs.  Current Medications, Allergies, Past Medical History, Past Surgical History, Family History and Social History were reviewed in [employer] record.  Physical Exam: BP 136/82   Pulse (!) 105   Ht 6\' 1"  (1.854 m)   Wt (!) 319 lb (144.7 kg)   BMI 42.09 kg/m  General:   Pleasant, well developed male in no acute distress Heart : Regular rate and rhythm; no murmurs Pulm: Clear anteriorly; no wheezing Abdomen:  Soft, Obese AB, Active bowel sounds. mild tenderness in the LLQ. Without guarding and Without rebound, No organomegaly appreciated. Rectal: Not evaluated Extremities:  without  edema. Neurologic:  Alert and  oriented x4;  No focal deficits.  Psych:  Cooperative. Normal mood and affect.   , PA-C 09/22/21

## 2021-09-22 ENCOUNTER — Encounter: Payer: Self-pay | Admitting: Physician Assistant

## 2021-09-22 ENCOUNTER — Other Ambulatory Visit (INDEPENDENT_AMBULATORY_CARE_PROVIDER_SITE_OTHER): Payer: Managed Care, Other (non HMO)

## 2021-09-22 ENCOUNTER — Ambulatory Visit: Payer: Managed Care, Other (non HMO) | Admitting: Physician Assistant

## 2021-09-22 VITALS — BP 136/82 | HR 105 | Ht 73.0 in | Wt 319.0 lb

## 2021-09-22 DIAGNOSIS — R634 Abnormal weight loss: Secondary | ICD-10-CM

## 2021-09-22 DIAGNOSIS — K529 Noninfective gastroenteritis and colitis, unspecified: Secondary | ICD-10-CM | POA: Diagnosis not present

## 2021-09-22 DIAGNOSIS — K219 Gastro-esophageal reflux disease without esophagitis: Secondary | ICD-10-CM

## 2021-09-22 LAB — COMPREHENSIVE METABOLIC PANEL
ALT: 24 U/L (ref 0–53)
AST: 19 U/L (ref 0–37)
Albumin: 4.8 g/dL (ref 3.5–5.2)
Alkaline Phosphatase: 45 U/L (ref 39–117)
BUN: 16 mg/dL (ref 6–23)
CO2: 28 mEq/L (ref 19–32)
Calcium: 9.6 mg/dL (ref 8.4–10.5)
Chloride: 103 mEq/L (ref 96–112)
Creatinine, Ser: 1.18 mg/dL (ref 0.40–1.50)
GFR: 78.59 mL/min (ref >60.00)
Glucose, Bld: 99 mg/dL (ref 70–99)
Potassium: 4.4 mEq/L (ref 3.5–5.1)
Sodium: 139 mEq/L (ref 135–145)
Total Bilirubin: 0.7 mg/dL (ref 0.2–1.2)
Total Protein: 7.5 g/dL (ref 6.0–8.3)

## 2021-09-22 LAB — CBC WITH DIFFERENTIAL/PLATELET
Basophils Absolute: 0 10*3/uL (ref 0.0–0.1)
Basophils Relative: 0.6 % (ref 0.0–3.0)
Eosinophils Absolute: 0.5 10*3/uL (ref 0.0–0.7)
Eosinophils Relative: 6.8 % — ABNORMAL HIGH (ref 0.0–5.0)
HCT: 42.5 % (ref 39.0–52.0)
Hemoglobin: 14.4 g/dL (ref 13.0–17.0)
Lymphocytes Relative: 37.9 % (ref 12.0–46.0)
Lymphs Abs: 2.6 10*3/uL (ref 0.7–4.0)
MCHC: 33.9 g/dL (ref 30.0–36.0)
MCV: 84.8 fl (ref 78.0–100.0)
Monocytes Absolute: 0.5 10*3/uL (ref 0.1–1.0)
Monocytes Relative: 7.3 % (ref 3.0–12.0)
Neutro Abs: 3.2 10*3/uL (ref 1.4–7.7)
Neutrophils Relative %: 47.4 % (ref 43.0–77.0)
Platelets: 197 10*3/uL (ref 150.0–400.0)
RBC: 5.01 Mil/uL (ref 4.22–5.81)
RDW: 13.5 % (ref 11.5–15.5)
WBC: 6.8 10*3/uL (ref 4.0–10.5)

## 2021-09-22 LAB — HIGH SENSITIVITY CRP: CRP, High Sensitivity: 2.1 mg/L (ref 0.000–5.000)

## 2021-09-22 LAB — TSH: TSH: 1.86 u[IU]/mL (ref 0.35–5.50)

## 2021-09-22 LAB — SEDIMENTATION RATE: Sed Rate: 8 mm/hr (ref 0–15)

## 2021-09-22 NOTE — Patient Instructions (Addendum)
You may have POST INFECTIOUS IBS OR IRRITABLE BOWEL After an infection or diverticulitis flare your intestines can spasm or be a little bit more sensitive. Try these things below:  Can low FODMAP- see below Try trial off milk/lactose products.  Can do trial of IBGard for AB pain EVERY DAY- Take 1-2 capsules once a day for maintence or twice a day during a flare if any worsening symptoms like blood in stool, weight loss, please call the office or go to the ER.    FODMAP stands for fermentable oligo-, di-, mono-saccharides and polyols (1). These are the scientific terms used to classify groups of carbs that are notorious for triggering digestive symptoms like bloating, gas and stomach pain.   FODMAPs are found in a wide range of foods in varying amounts. Some foods contain just one type, while others contain several.  The main dietary sources of the four groups of FODMAPs include:  Oligosaccharides: Wheat, rye, legumes and various fruits and vegetables, such as garlic and onions.  Disaccharides: Milk, yogurt and soft cheese. Lactose is the main carb.  Monosaccharides: Various fruit including figs and mangoes, and sweeteners such as honey and agave nectar. Fructose is the main carb.  Polyols: Certain fruits and vegetables including blackberries and lychee, as well as some low-calorie sweeteners like those in sugar-free gum.   Keep a food diary. This will help you identify foods that cause symptoms. Write down: What you eat and when. What symptoms you have. When symptoms occur in relation to your meals. Avoid foods that cause symptoms. Talk with your dietitian about other ways to get the same nutrients that are in these foods. Eat your meals slowly, in a relaxed setting. Aim to eat 5-6 small meals per day. Do not skip meals. Drink enough fluids to keep your urine clear or pale yellow. If dairy products cause your symptoms to flare up, try eating less of them. You might be able to handle  yogurt better than other dairy products because it contains bacteria that help with digestion.      Due to recent changes in healthcare laws, you may see the results of your imaging and laboratory studies on MyChart before your provider has had a chance to review them.  We understand that in some cases there may be results that are confusing or concerning to you. Not all laboratory results come back in the same time frame and the provider may be waiting for multiple results in order to interpret others.  Please give [place] 48 hours in order for your provider to thoroughly review all the results before contacting the office for clarification of your results.    If you are age 19 or older, your body mass index should be between 23-30. Your Body mass index is 42.09 kg/m. If this is out of the aforementioned range listed, please consider follow up with your Primary Care Provider.  If you are age 61 or younger, your body mass index should be between 19-25. Your Body mass index is 42.09 kg/m. If this is out of the aformentioned range listed, please consider follow up with your Primary Care Provider.   ________________________________________________________  The [facility] providers would like to encourage you to use [facility] to communicate with providers for non-urgent requests or questions.  Due to long hold times on the telephone, sending your provider a message by [facility] may be a faster and more efficient way to get a response.  Please allow 48 business hours for a response.  Please remember that this is for non-urgent requests.  _______________________________________________________   I appreciate the  opportunity to care for you  Thank You   [facility]

## 2021-09-24 NOTE — Progress Notes (Signed)
____________________________________________________________  Attending physician addendum:  Thank you for sending this case to me. I have reviewed the entire note and agree with the plan.  Also, his labs that day were normal.  Amada Jupiter, MD  ____________________________________________________________

## 2021-10-12 ENCOUNTER — Other Ambulatory Visit: Payer: Self-pay

## 2021-10-12 ENCOUNTER — Encounter: Payer: Self-pay | Admitting: Family Medicine

## 2021-10-12 MED ORDER — SEMAGLUTIDE (1 MG/DOSE) 4 MG/3ML ~~LOC~~ SOPN: PEN_INJECTOR | SUBCUTANEOUS | 1 refills | Status: DC

## 2021-10-12 MED ORDER — SEMAGLUTIDE (1 MG/DOSE) 4 MG/3ML ~~LOC~~ SOPN: PEN_INJECTOR | SUBCUTANEOUS | 0 refills | Status: DC

## 2021-10-14 ENCOUNTER — Encounter: Payer: Self-pay | Admitting: Gastroenterology

## 2021-10-15 ENCOUNTER — Encounter: Payer: Managed Care, Other (non HMO) | Admitting: Gastroenterology

## 2021-10-21 ENCOUNTER — Encounter: Payer: Self-pay | Admitting: Gastroenterology

## 2021-10-21 ENCOUNTER — Ambulatory Visit: Payer: Managed Care, Other (non HMO) | Admitting: Gastroenterology

## 2021-10-21 VITALS — BP 95/51 | HR 76 | Temp 97.5°F | Resp 16 | Ht 73.0 in | Wt 319.0 lb

## 2021-10-21 DIAGNOSIS — K529 Noninfective gastroenteritis and colitis, unspecified: Secondary | ICD-10-CM

## 2021-10-21 MED ORDER — SODIUM CHLORIDE 0.9 % IV SOLN: 500.0000 mL | Freq: Once | INTRAVENOUS | Status: DC

## 2021-10-21 NOTE — Op Note (Signed)
[facility] Patient Name: Brian Lam Procedure Date: 10/21/2021 10:30 AM MRN: [id number] Endoscopist: Mallie Mussel L. Loletha Carrow , MD Age: 36 Referring MD:  Date of Birth: Apr 09, 1985 Gender: Male Account #: [id number] Procedure:                Colonoscopy Indications:              Chronic diarrhea                           Episodic for years, clinical Hx suggests IBS.                            Negative Celiac labs a year ago, negative recent                            CBC, CMP, ESR and CRP                           3 episodes lasting > a week in last 9 months                            without sick contacts Medicines:                Monitored Anesthesia Care Procedure:                Pre-Anesthesia Assessment:                           - Prior to the procedure, a History and Physical                            was performed, and patient medications and                            allergies were reviewed. The patient's tolerance of                            previous anesthesia was also reviewed. The risks                            and benefits of the procedure and the sedation                            options and risks were discussed with the patient.                            All questions were answered, and informed consent                            was obtained. Prior Anticoagulants: The patient has                            taken no previous anticoagulant or antiplatelet  agents. ASA Grade Assessment: III - A patient with                            severe systemic disease. After reviewing the risks                            and benefits, the patient was deemed in                            satisfactory condition to undergo the procedure.                           After obtaining informed consent, the colonoscope                            was passed under direct vision. Throughout the                            procedure, the patient's blood  pressure, pulse, and                            oxygen saturations were monitored continuously. The                            Olympus CF-[id number]) Colonoscope was                            introduced through the anus and advanced to the the                            terminal ileum, with identification of the                            appendiceal orifice and IC valve. The colonoscopy                            was somewhat difficult due to a redundant colon.                            Successful completion of the procedure was aided by                            using manual pressure and straightening and                            shortening the scope to obtain bowel loop                            reduction. The patient tolerated the procedure                            well. The quality of the bowel preparation was  excellent. The terminal ileum, ileocecal valve,                            appendiceal orifice, and rectum were photographed. Scope In: 10:39:50 AM Scope Out: 10:54:27 AM Scope Withdrawal Time: 0 hours 10 minutes 7 seconds  Total Procedure Duration: 0 hours 14 minutes 37 seconds  Findings:                 The perianal and digital rectal examinations were                            normal.                           The terminal ileum appeared normal.                           Repeat examination of right colon under NBI                            performed.                           Normal mucosa was found in the entire colon.                            Biopsies for histology were taken with a cold                            forceps from the right colon and left colon for                            evaluation of microscopic colitis.                           The exam was otherwise without abnormality on                            direct and retroflexion views. Complications:            No immediate complications. Estimated Blood Loss:      Estimated blood loss was minimal. Impression:               - The examined portion of the ileum was normal.                           - Normal mucosa in the entire examined colon.                            Biopsied.                           - The examination was otherwise normal on direct                            and retroflexion views. Recommendation:           -  Patient has a contact number available for                            emergencies. The signs and symptoms of potential                            delayed complications were discussed with the                            patient. Return to normal activities tomorrow.                            Written discharge instructions were provided to the                            patient.                           - Resume previous diet.                           - Continue present medications.                           - Await pathology results.                           - Repeat colonoscopy in 10 years for screening                            purposes. Anjenette Gerbino L. Loletha Carrow, MD 10/21/2021 11:01:23 AM This report has been signed electronically.

## 2021-10-21 NOTE — Progress Notes (Signed)
PT taken to PACU. Monitors in place. VSS. Report given to RN. 

## 2021-10-21 NOTE — Progress Notes (Signed)
No changes to clinical history since GI office visit on 09/22/21.  The patient is appropriate for an endoscopic procedure in the ambulatory setting.  - Dolores Mcgovern Danis, MD    

## 2021-10-21 NOTE — Progress Notes (Signed)
Called to room to assist during endoscopic procedure.  Patient ID and intended procedure confirmed with present staff. Received instructions for my participation in the procedure from the performing physician.  

## 2021-10-21 NOTE — Patient Instructions (Signed)
YOU HAD AN ENDOSCOPIC PROCEDURE TODAY AT THE [facility]:   Refer to the procedure report that was given to you for any specific questions about what was found during the examination.  If the procedure report does not answer your questions, please call your gastroenterologist to clarify.  If you requested that your care partner not be given the details of your procedure findings, then the procedure report has been included in a sealed envelope for you to review at your convenience later.  YOU SHOULD EXPECT: Some feelings of bloating in the abdomen. Passage of more gas than usual.  Walking can help get rid of the air that was put into your GI tract during the procedure and reduce the bloating. If you had a lower endoscopy (such as a colonoscopy or flexible sigmoidoscopy) you may notice spotting of blood in your stool or on the toilet paper. If you underwent a bowel prep for your procedure, you may not have a normal bowel movement for a few days.  Please Note:  You might notice some irritation and congestion in your nose or some drainage.  This is from the oxygen used during your procedure.  There is no need for concern and it should clear up in a day or so.  SYMPTOMS TO REPORT IMMEDIATELY:  Following lower endoscopy (colonoscopy or flexible sigmoidoscopy):  Excessive amounts of blood in the stool  Significant tenderness or worsening of abdominal pains  Swelling of the abdomen that is new, acute  Fever of 100F or higher  For urgent or emergent issues, a gastroenterologist can be reached at any hour by calling [phone]. Do not use MyChart messaging for urgent concerns.    DIET:  We do recommend a small meal at first, but then you may proceed to your regular diet.  Drink plenty of fluids but you should avoid alcoholic beverages for 24 hours.  ACTIVITY:  You should plan to take it easy for the rest of today and you should NOT DRIVE or use heavy machinery until tomorrow (because of  the sedation medicines used during the test).    FOLLOW UP: Our staff will call the number listed on your records the next business day following your procedure.  We will call around 7:15- 8:00 am to check on you and address any questions or concerns that you may have regarding the information given to you following your procedure. If we do not reach you, we will leave a message.  If you develop any symptoms (ie: fever, flu-like symptoms, shortness of breath, cough etc.) before then, please call [phone].  If you test positive for Covid 19 in the 2 weeks post procedure, please call and report this information to us.    If any biopsies were taken you will be contacted by phone or by letter within the next 1-3 weeks.  Please call us at [phone] if you have not heard about the biopsies in 3 weeks.    SIGNATURES/CONFIDENTIALITY: You and/or your care partner have signed paperwork which will be entered into your electronic medical record.  These signatures attest to the fact that that the information above on your After Visit Summary has been reviewed and is understood.  Full responsibility of the confidentiality of this discharge information lies with you and/or your care-partner.  

## 2021-10-21 NOTE — Progress Notes (Signed)
No change Pt's states no medical or surgical changes since previsit or office visit.

## 2021-10-22 ENCOUNTER — Telehealth: Payer: Self-pay | Admitting: *Deleted

## 2021-10-22 NOTE — Telephone Encounter (Signed)
  Follow up Call-     10/21/2021   10:09 AM 10/10/2020    9:21 AM  Call back number  Post procedure Call Back phone  # [phone]  Permission to leave phone message Yes Yes     Patient questions:  Do you have a fever, pain , or abdominal swelling? No. Pain Score  0 *  Have you tolerated food without any problems? Yes.    Have you been able to return to your normal activities? Yes.    Do you have any questions about your discharge instructions: Diet   No. Medications  No. Follow up visit  No.  Do you have questions or concerns about your Care? No.  Actions: * If pain score is 4 or above: No action needed, pain <4.

## 2021-10-28 ENCOUNTER — Encounter: Payer: Self-pay | Admitting: Gastroenterology

## 2021-10-28 MED ORDER — HYOSCYAMINE SULFATE 0.125 MG SL SUBL: 0.1250 mg | SUBLINGUAL_TABLET | Freq: Four times a day (QID) | SUBLINGUAL | 2 refills | Status: DC | PRN

## 2021-10-28 NOTE — Telephone Encounter (Signed)
See note from pt and advise.

## 2021-10-28 NOTE — Telephone Encounter (Signed)
Rx for hyoscyamine sent to his pharmacy.  - HD

## 2021-11-05 ENCOUNTER — Encounter: Payer: Self-pay | Admitting: Family Medicine

## 2021-11-06 ENCOUNTER — Ambulatory Visit: Payer: Managed Care, Other (non HMO) | Admitting: Family Medicine

## 2021-11-06 ENCOUNTER — Encounter: Payer: Self-pay | Admitting: Family Medicine

## 2021-11-06 VITALS — BP 104/60 | HR 105 | Temp 98.1°F | Ht 73.0 in | Wt 318.1 lb

## 2021-11-06 DIAGNOSIS — R109 Unspecified abdominal pain: Secondary | ICD-10-CM

## 2021-11-06 LAB — POC URINALSYSI DIPSTICK (AUTOMATED)
Bilirubin, UA: NEGATIVE
Blood, UA: NEGATIVE
Glucose, UA: NEGATIVE
Ketones, UA: NEGATIVE
Leukocytes, UA: NEGATIVE
Nitrite, UA: NEGATIVE
Protein, UA: POSITIVE — AB
Spec Grav, UA: >=1.03 — AB (ref 1.010–1.025)
Urobilinogen, UA: 0.2 E.U./dL
pH, UA: 5.5 (ref 5.0–8.0)

## 2021-11-06 MED ORDER — HYDROCODONE-ACETAMINOPHEN 5-325 MG PO TABS: 1.0000 | ORAL_TABLET | Freq: Four times a day (QID) | ORAL | 0 refills | Status: DC | PRN

## 2021-11-06 MED ORDER — TAMSULOSIN HCL 0.4 MG PO CAPS: 0.4000 mg | ORAL_CAPSULE | Freq: Every day | ORAL | 0 refills | Status: DC

## 2021-11-06 NOTE — Progress Notes (Signed)
Established Patient Office Visit  Subjective   Patient ID: Brian Lam, male    DOB: 09/04/1985  Age: 36 y.o. MRN: [id number]  Chief Complaint  Patient presents with   Flank Pain   Nephrolithiasis    HPI   Concern for possible recent kidney stone.  He states very early Thursday around 4 AM he woke up with severe right flank pain.  This radiated somewhat anteriorly.  Last about 20 to 30 minutes.  No worse and no better with position change.  No urinary symptoms.  No gross hematuria.  Pain eventually resolved and has had no recurrent pain since then.  His concern was kidney stones.  Apparently both parents had kidney stones.  Past Medical History:  Diagnosis Date   Chronic knee pain    Contact dermatitis    Depression    GERD (gastroesophageal reflux disease)    Migraines    Past Surgical History:  Procedure Laterality Date   VASECTOMY  2020    reports that he has never smoked. He has never used smokeless tobacco. He reports current alcohol use. He reports that he does not use drugs. family history includes Diabetes in his father and maternal grandfather; Diabetes type II in his father; Heart attack in his maternal grandfather. No Known Allergies  Review of Systems  Constitutional:  Negative for chills and fever.  Gastrointestinal:  Negative for abdominal pain.  Genitourinary:  Negative for dysuria, flank pain and hematuria.      Objective:     BP 104/60 (BP Location: Left Arm, Patient Position: Sitting, Cuff Size: Large)   Pulse (!) 105   Temp 98.1 F (36.7 C) (Oral)   Ht 6\' 1"  (1.854 m)   Wt (!) 318 lb 1.6 oz (144.3 kg)   SpO2 97%   BMI 41.97 kg/m  BP Readings from Last 3 Encounters:  11/06/21 104/60  10/21/21 (!) 95/51  09/22/21 136/82   Wt Readings from Last 3 Encounters:  11/06/21 (!) 318 lb 1.6 oz (144.3 kg)  10/21/21 (!) 319 lb (144.7 kg)  09/22/21 (!) 319 lb (144.7 kg)      Physical Exam Vitals reviewed.  Cardiovascular:     Rate and  Rhythm: Normal rate and regular rhythm.  Abdominal:     Palpations: Abdomen is soft.     Comments: Nontender to palpation at this time.  Nondistended.  Neurological:     Mental Status: He is alert.      Results for orders placed or performed in visit on 11/06/21  POCT Urinalysis Dipstick (Automated)  Result Value Ref Range   Color, UA yellow    Clarity, UA clear    Glucose, UA Negative Negative   Bilirubin, UA negative    Ketones, UA negative    Spec Grav, UA >=1.030 (A) 1.010 - 1.025   Blood, UA negative    pH, UA 5.5 5.0 - 8.0   Protein, UA Positive (A) Negative   Urobilinogen, UA 0.2 0.2 or 1.0 E.U./dL   Nitrite, UA Negative    Leukocytes, UA Negative Negative      The ASCVD Risk score (Arnett DK, et al., 2019) failed to calculate for the following reasons:   The 2019 ASCVD risk score is only valid for ages 86 to 76    Assessment & Plan:   Problem List Items Addressed This Visit   None Visit Diagnoses     Flank pain    -  Primary   Relevant Orders   POCT Urinalysis  Dipstick (Automated) (Completed)     Patient had acute right flank pain recently.  Symptoms sounded suspicious for kidney stone.  Had severe pain for about 30 minutes which radiated somewhat anteriorly and then resolved.  No urinary symptoms at this time.  Urine dipstick today shows no blood.  -Stay well-hydrated -Observe for now -Follow-up immediately if he has any recurrent pain -Consider imaging with CT nephrogram if he has recurrent symptoms -We did print prescription for Flomax 0.4 mg 1 daily and Vicodin 5/325 mg 1-2 every 6 hours as needed for severe pain if he has any recurrent severe pain  No follow-ups on file.    Evelena Peat, MD

## 2021-12-14 ENCOUNTER — Other Ambulatory Visit: Payer: Self-pay | Admitting: Family Medicine

## 2022-01-17 ENCOUNTER — Encounter: Payer: Self-pay | Admitting: Family Medicine

## 2022-01-18 MED ORDER — SEMAGLUTIDE (2 MG/DOSE) 8 MG/3ML ~~LOC~~ SOPN: PEN_INJECTOR | SUBCUTANEOUS | 2 refills | Status: DC

## 2022-01-18 MED ORDER — OZEMPIC (1 MG/DOSE) 4 MG/3ML ~~LOC~~ SOPN: PEN_INJECTOR | SUBCUTANEOUS | 1 refills | Status: DC

## 2022-01-18 NOTE — Addendum Note (Signed)
Addended by: Rosalee Kaufman L on: 01/18/2022 02:00 PM   Modules accepted: Orders

## 2022-02-03 ENCOUNTER — Other Ambulatory Visit: Payer: Self-pay | Admitting: Family Medicine

## 2022-03-29 ENCOUNTER — Encounter: Payer: Self-pay | Admitting: Family Medicine

## 2022-03-30 MED ORDER — TIRZEPATIDE 2.5 MG/0.5ML ~~LOC~~ SOAJ: SUBCUTANEOUS | 1 refills | Status: DC

## 2022-04-08 ENCOUNTER — Encounter: Payer: Self-pay | Admitting: Family Medicine

## 2022-04-09 ENCOUNTER — Encounter: Payer: Self-pay | Admitting: Family Medicine

## 2022-04-09 MED ORDER — FLUOXETINE HCL 40 MG PO CAPS: 40.0000 mg | ORAL_CAPSULE | Freq: Every day | ORAL | 0 refills | Status: DC

## 2022-04-09 MED ORDER — TAMSULOSIN HCL 0.4 MG PO CAPS: 0.4000 mg | ORAL_CAPSULE | Freq: Every day | ORAL | 0 refills | Status: DC

## 2022-04-14 NOTE — Telephone Encounter (Signed)
Pharmacy Patient Advocate Encounter  Received notification from [facility] that the request for prior authorization for Darcel Bayley has been denied due to .    Please be advised we currently do not have a Pharmacist to review denials, therefore you will need to process appeals accordingly as needed. Thanks for your support at this time.   You may call [phone] or fax [phone], to appeal.

## 2022-05-03 ENCOUNTER — Ambulatory Visit: Payer: Managed Care, Other (non HMO) | Admitting: Family Medicine

## 2022-05-03 VITALS — BP 114/70 | HR 84 | Temp 98.0°F | Ht 73.0 in | Wt 318.8 lb

## 2022-05-03 DIAGNOSIS — K219 Gastro-esophageal reflux disease without esophagitis: Secondary | ICD-10-CM | POA: Diagnosis not present

## 2022-05-03 DIAGNOSIS — R079 Chest pain, unspecified: Secondary | ICD-10-CM

## 2022-05-03 NOTE — Progress Notes (Signed)
Established Patient Office Visit  Subjective   Patient ID: Brian Lam, male    DOB: 01-13-1986  Age: 37 y.o. MRN: [id number]  Chief Complaint  Patient presents with   Chest Pain    Patient complains of chest pain, x4 days, Patient reported chest tightness and Dyspnea    HPI   Brian Lam has history of migraine headaches, GERD, dyshidrotic eczema, obesity.  He is seen today with episode of chest pain last Thursday night.  He states that [facility] and was diagnosed with an "ear infection" and started on Augmentin.  He had gone to bed and woke up around 10:30 PM with some substernal chest pain that was described as tightness and he had some low-grade baseline chest pain (lasting about one hour) with superimposed intermittent 10 to 15-second episodes of more severe tightness.  No diaphoresis.  No nausea or vomiting.  Felt slight dyspnea.  Some radiation to her both arms.  Eventually went to sleep.  No episodes of chest pain since then.  No recent exertional chest pain whatsoever.  He did notice a little bit of achiness across his chest the next day but not the same kind of pain.  No recent cough.  No fever.  No pleuritic pain.  No dysphagia.  Does have history of GERD.  Takes omeprazole 20 mg daily supplemented with Pepcid 20 mg at night.  No recent typical GERD symptoms.  No family history of premature CAD.  He had a maternal grandfather had CAD around age 2 and presumably died of MI.  His parents do not have known CAD.  He has no history of smoking.  No history of diabetes.  History of slightly low HDL cholesterol with most recent LDL 95.  Non-smoker  Past Medical History:  Diagnosis Date   Chronic knee pain    Contact dermatitis    Depression    GERD (gastroesophageal reflux disease)    Migraines    Past Surgical History:  Procedure Laterality Date   VASECTOMY  2020    reports that he has never smoked. He has never used smokeless  tobacco. He reports current alcohol use. He reports that he does not use drugs. family history includes Diabetes in his father and maternal grandfather; Diabetes type II in his father; Heart attack in his maternal grandfather. No Known Allergies  Review of Systems  Constitutional:  Negative for chills, fever and malaise/fatigue.  Eyes:  Negative for blurred vision.  Respiratory:  Negative for shortness of breath.   Cardiovascular:  Negative for palpitations, claudication, leg swelling and PND.  Neurological:  Negative for dizziness, weakness and headaches.      Objective:     BP 114/70 (BP Location: Left Arm, Patient Position: Sitting, Cuff Size: Large)   Pulse 84   Temp 98 F (36.7 C) (Oral)   Ht 6' 1"$  (1.854 m)   Wt (!) 318 lb 12.8 oz (144.6 kg)   SpO2 98%   BMI 42.06 kg/m  BP Readings from Last 3 Encounters:  05/03/22 114/70  11/06/21 104/60  10/21/21 (!) 95/51   Wt Readings from Last 3 Encounters:  05/03/22 (!) 318 lb 12.8 oz (144.6 kg)  11/06/21 (!) 318 lb 1.6 oz (144.3 kg)  10/21/21 (!) 319 lb (144.7 kg)      Physical Exam Vitals reviewed.  Constitutional:      Appearance: He is well-developed.  HENT:     Right Ear: External ear normal.  Left Ear: External ear normal.  Eyes:     Pupils: Pupils are equal, round, and reactive to light.  Neck:     Thyroid: No thyromegaly.  Cardiovascular:     Rate and Rhythm: Normal rate and regular rhythm.  Pulmonary:     Effort: Pulmonary effort is normal. No respiratory distress.     Breath sounds: Normal breath sounds. No wheezing or rales.  Chest:     Chest wall: No edema. There is no dullness to percussion.  Musculoskeletal:     Cervical back: Neck supple.  Neurological:     Mental Status: He is alert and oriented to person, place, and time.      No results found for any visits on 05/03/22.  Last CBC Lab Results  Component Value Date   WBC 6.8 09/22/2021   HGB 14.4 09/22/2021   HCT 42.5 09/22/2021    MCV 84.8 09/22/2021   RDW 13.5 09/22/2021   PLT 197.0 [id number]   Last metabolic panel Lab Results  Component Value Date   GLUCOSE 99 09/22/2021   NA 139 09/22/2021   K 4.4 09/22/2021   CL 103 09/22/2021   CO2 28 09/22/2021   BUN 16 09/22/2021   CREATININE 1.18 09/22/2021   CALCIUM 9.6 09/22/2021   PROT 7.5 09/22/2021   ALBUMIN 4.8 09/22/2021   BILITOT 0.7 09/22/2021   ALKPHOS 45 09/22/2021   AST 19 09/22/2021   ALT 24 09/22/2021   Last lipids Lab Results  Component Value Date   CHOL 164 06/12/2021   HDL 35.30 (L) 06/12/2021   LDLCALC 95 06/12/2021   TRIG 170.0 (H) 06/12/2021   CHOLHDL 5 06/12/2021   Last hemoglobin A1c Lab Results  Component Value Date   HGBA1C 5.9 06/12/2021   Last thyroid functions Lab Results  Component Value Date   TSH 1.86 09/22/2021      The ASCVD Risk score (Arnett DK, et al., 2019) failed to calculate for the following reasons:   The 2019 ASCVD risk score is only valid for ages 35 to 62    Assessment & Plan:   Patient presents with 1 episode of somewhat atypical chest pain at rest last Thursday night.  Lasted approximately 1 hour.  No recent exertional chest pain.  Overall fairly low risk for CAD.  EKG shows sinus rhythm with no acute ST-T changes.  He does have history of known reflux but takes omeprazole and Pepcid regularly and states the symptoms were not typical of his reflux.  I feel his risk for CAD is very low but we do not have a clear explanation for his recent symptoms  -Continue PPI along with Pepcid supplement as needed -Avoid eating within 3 hours of bedtime -We discussed setting up cardiology referral for further evaluation and he agrees   No follow-ups on file.    Carolann Littler, MD

## 2022-05-06 ENCOUNTER — Other Ambulatory Visit: Payer: Self-pay | Admitting: Family Medicine

## 2022-05-07 ENCOUNTER — Encounter: Payer: Self-pay | Admitting: Family Medicine

## 2022-05-07 MED ORDER — OMEPRAZOLE 20 MG PO CPDR: DELAYED_RELEASE_CAPSULE | ORAL | 0 refills | Status: DC

## 2022-05-10 NOTE — Progress Notes (Unsigned)
Cardiology Office Note:   Date:  05/12/2022  NAME:  Brian Lam    MRN: [id number] DOB:  [birth year]   PCP:  Eulas Post, MD  Cardiologist:  None  Electrophysiologist:  None   Referring MD: Eulas Post, MD   Chief Complaint  Patient presents with   Chest Pain    History of Present Illness:   Brian Lam is a 37 y.o. male with a hx of GERD who is being seen today for the evaluation of chest pain at the request of Burchette, Alinda Sierras, MD. he reports for the past 2 weeks has had 2 episodes.  He describes being woken up in the middle of the night during the first episode.  He reports he had squeezing in his chest.  Described as achiness.  Symptoms lasted several hours and resolved without intervention.  Symptoms were not worsened by activity.  They are not alleviated by rest.  He then had a subsequent episode not as severe that occurred in the morning.  He reports he woke up with similar symptoms but were less frequent.  He does snore.  He has fatigue.  He can fall asleep quite easily.  His BMI is 43.  I do wonder if he has sleep apnea.  His EKG shows normal sinus rhythm with no acute ischemic changes or evidence of infarction.  His LDL cholesterol is not that bad.  There is family history of heart disease in a grandfather.  He has never had a heart attack or stroke.  He does work as a homicide Tax adviser.  He reports his work is stressful.  He is married.  He has 3 children.  He does not smoke.  He reports alcohol rarely.  No drug use.  He does not exercise.  He is obese with a BMI of 43 again.  Symptoms are quite concerning for sleep apnea.  He can doze off very easily.  T chol 164, HDL 35, LDL 95, TG 170  Past Medical History: Past Medical History:  Diagnosis Date   Chronic knee pain    Contact dermatitis    Depression    GERD (gastroesophageal reflux disease)    Migraines     Past Surgical History: Past Surgical History:  Procedure Laterality Date   VASECTOMY  2020     Current Medications: Current Meds  Medication Sig   clobetasol ointment (TEMOVATE) [id number] % 1 application to affected area Externally Twice a day for 14 day(s)   desoximetasone (TOPICORT) AB-123456789 % cream 1 application to affected area Externally Twice a day   desoximetasone (TOPICORT) 0.25 % cream Apply 1 application topically 2 (two) times daily.   famotidine (PEPCID) 20 MG tablet Take 20 mg by mouth at bedtime.   FLUoxetine (PROZAC) 40 MG capsule TAKE 1 CAPSULE BY MOUTH DAILY   omeprazole (PRILOSEC) 20 MG capsule Take 1 tablet by mouth twice daily   propranolol ER (INDERAL LA) 120 MG 24 hr capsule TAKE ONE CAPSULE BY MOUTH DAILY   rizatriptan (MAXALT-MLT) 10 MG disintegrating tablet Take 1 tablet (10 mg total) by mouth as needed for migraine. May repeat in 2 hours if needed   tamsulosin (FLOMAX) 0.4 MG CAPS capsule TAKE 1 CAPSULE BY MOUTH DAILY     Allergies:    Patient has no known allergies.   Social History: Social History   Socioeconomic History   Marital status: Married    Spouse name: Not on file   Number of children: 3   Years of  education: Not on file   Highest education level: Not on file  Occupational History   Occupation: Homicide Detective  Tobacco Use   Smoking status: Never   Smokeless tobacco: Never  Vaping Use   Vaping Use: Never used  Substance and Sexual Activity   Alcohol use: Yes    Comment: 2 to 3 drinks a week   Drug use: No   Sexual activity: Not on file  Other Topics Concern   Not on file  Social History Narrative   Not on file   Social Determinants of Health   Financial Resource Strain: Not on file  Food Insecurity: Not on file  Transportation Needs: Not on file  Physical Activity: Not on file  Stress: Not on file  Social Connections: Not on file     Family History: The patient's family history includes Diabetes in his father and maternal grandfather; Diabetes type II in his father; Heart attack in his maternal grandfather. There is no  history of Colon cancer, Esophageal cancer, Stomach cancer, or Rectal cancer.  ROS:   All other ROS reviewed and negative. Pertinent positives noted in the HPI.     EKGs/Labs/Other Studies Reviewed:   The following studies were personally reviewed by me today:  EKG:  EKG is ordered today.  The ekg ordered today demonstrates normal sinus rhythm heart rate 67, no acute ischemic changes or evidence of infarction, and was personally reviewed by me.   Recent Labs: 09/22/2021: ALT 24; BUN 16; Creatinine, Ser 1.18; Hemoglobin 14.4; Platelets 197.0; Potassium 4.4; Sodium 139; TSH 1.86   Recent Lipid Panel    Component Value Date/Time   CHOL 164 06/12/2021 0923   TRIG 170.0 (H) 06/12/2021 0923   HDL 35.30 (L) 06/12/2021 0923   CHOLHDL 5 06/12/2021 0923   VLDL 34.0 06/12/2021 0923   LDLCALC 95 06/12/2021 0923    Physical Exam:   VS:  BP 108/72   Pulse 80   Ht 6' (1.829 m)   Wt (!) 318 lb 6.4 oz (144.4 kg)   SpO2 97%   BMI 43.18 kg/m    Wt Readings from Last 3 Encounters:  05/12/22 (!) 318 lb 6.4 oz (144.4 kg)  05/03/22 (!) 318 lb 12.8 oz (144.6 kg)  11/06/21 (!) 318 lb 1.6 oz (144.3 kg)    General: Well nourished, well developed, in no acute distress Head: Atraumatic, normal size  Eyes: PEERLA, EOMI  Neck: Supple, no JVD Endocrine: No thryomegaly Cardiac: Normal S1, S2; RRR; no murmurs, rubs, or gallops Lungs: Clear to auscultation bilaterally, no wheezing, rhonchi or rales  Abd: Soft, nontender, no hepatomegaly  Ext: No edema, pulses 2+ Musculoskeletal: No deformities, BUE and BLE strength normal and equal Skin: Warm and dry, no rashes   Neuro: Alert and oriented to person, place, time, and situation, CNII-XII grossly intact, no focal deficits  Psych: Normal mood and affect   ASSESSMENT:   Brian Lam is a 37 y.o. male who presents for the following: 1. Precordial pain   2. Obesity, morbid, BMI 40.0-49.9 ([place])   3. Snoring   4. Other fatigue     PLAN:   1.  Precordial pain 2. Obesity, morbid, BMI 40.0-49.9 ([place]) 3. Snoring 4. Other fatigue -Suspect noncardiac chest pain.  Occurs at night.  Also in the morning hours.  It wakes him up.  He screens positive for sleep apnea.  STOP-BANG score is 6.  I highly suspect he has sleep apnea to explain his symptoms.  We will set  him up for a home sleep study.  His EKG is normal.  Symptoms are noncardiac on my review of his symptomology.  We will proceed with an exercise treadmill stress test just to exclude anything worrisome.  Given his young age I have a very low suspicion for cardiac pathology.  Again if he has sleep apnea this could clearly explain his symptoms.  He will see me back as needed based on the results of his testing.  We will let his primary care physician know.  Shared Decision Making/Informed Consent The risks [chest pain, shortness of breath, cardiac arrhythmias, dizziness, blood pressure fluctuations, myocardial infarction, stroke/transient ischemic attack, and life-threatening complications (estimated to be 1 in 10,000)], benefits (risk stratification, diagnosing coronary artery disease, treatment guidance) and alternatives of an exercise tolerance test were discussed in detail with Mr. Kohlbeck and he agrees to proceed.  Disposition: Return if symptoms worsen or fail to improve.  Medication Adjustments/Labs and Tests Ordered: Current medicines are reviewed at length with the patient today.  Concerns regarding medicines are outlined above.  Orders Placed This Encounter  Procedures   EXERCISE TOLERANCE TEST (ETT)   EKG 12-Lead   Itamar Sleep Study   No orders of the defined types were placed in this encounter.   Patient Instructions  Medication Instructions:  The current medical regimen is effective;  continue present plan and medications.  *If you need a refill on your cardiac medications before your next appointment, please call your pharmacy*   Testing/Procedures:  Your  physician has requested that you have an exercise tolerance test, this is a screening tool to track your fitness level. This test evaluates the your exercise capacity by measuring cardiovascular response to exercise, the stress response is induced by exercise (exercise-treadmill).  Graded exercise test is also known as maximal exercise test or stress EKG test  . Please also follow instruction sheet given.   WatchPAT?  Is a FDA cleared portable home sleep study test that uses a watch and 3 points of contact to monitor 7 different channels, including your heart rate, oxygen saturations, body position, snoring, and chest motion.  The study is easy to use from the comfort of your own home and accurately detect sleep apnea.  Before bed, you attach the chest sensor, attached the sleep apnea bracelet to your nondominant hand, and attach the finger probe.  After the study, the raw data is downloaded from the watch and scored for apnea events.   For more information: [email or web address]  Patient Testing Instructions:  Do not put battery into the device until bedtime when you are ready to begin the test. Please call the support number if you need assistance after following the instructions below: 24 hour support line- [phone] or ITAMAR support at [phone] (option 2)  Download the The First AmericanWatchPAT One" app through the google play store or App Store  Be sure to turn on or enable access to bluetooth in settlings on your smartphone/ device  Make sure no other bluetooth devices are on and within the vicinity of your smartphone/ device and WatchPAT watch during testing.  Make sure to leave your smart phone/ device plugged in and charging all night.  When ready for bed:  Follow the instructions step by step in the WatchPAT One App to activate the testing device. For additional instructions, including video instruction, visit the WatchPAT One video on Youtube. You can search for [place]  One within Youtube (video is 4 minutes and 18 seconds) or enter: https://youtube/watch?[email or web address]_vbiwxE Please note:  You will be prompted to enter a Pin to connect via bluetooth when starting the test. The PIN will be assigned to you when you receive the test.  The device is disposable, but it recommended that you retain the device until you receive a call letting you know the study has been received and the results have been interpreted.  We will let you know if the study did not transmit to [place] properly after the test is completed. You do not need to call us to confirm the receipt of the test.  Please complete the test within 48 hours of receiving PIN.   Frequently Asked Questions:  What is Watch [place] one?  A single use fully disposable home sleep apnea testing device and will not need to be returned after completion.  What are the requirements to use WatchPAT one?  The be able to have a successful watchpat one sleep study, you should have your Watch pat one device, your smart phone, watch pat one app, your PIN number and Internet access What type of phone do I need?  You should have a smart phone that uses Android 5.1 and above or any Iphone with IOS 10 and above How can I download the WatchPAT one app?  Based on your device type search for WatchPAT one app either in google play for android devices or APP store for Iphone's Where will I get my PIN for the study?  Your PIN will be provided by your physician's office. It is used for authentication and if you lose/forget your PIN, please reach out to your providers office.  I do not have Internet at home. Can I do WatchPAT one study?  WatchPAT One needs Internet connection throughout the night to be able to transmit the sleep data. You can use your home/local internet or your cellular's data package. However, it is always recommended to use home/local Internet. It is estimated that between 20MB-30MB will be used with each study.However, the application  will be looking for 80MB space in the phone to start the study.  What happens if I lose internet or bluetooth connection?  During the internet disconnection, your phone will not be able to transmit the sleep data. All the data, will be stored in your phone. As soon as the internet connection is back on, the phone will being sending the sleep data. During the bluetooth disconnection, WatchPAT one will not be able to to send the sleep data to your phone. Data will be kept in the [facility] one until two devices have bluetooth connection back on. As soon as the connection is back on, WatchPAT one will send the sleep data to the phone.  How long do I need to wear the WatchPAT one?  After you start the study, you should wear the device at least 6 hours.  How far should I keep my phone from the device?  During the night, your phone should be within 15 feet.  What happens if I leave the room for restroom or other reasons?  Leaving the room for any reason will not cause any problem. As soon as your get back to the room, both devices will reconnect and will continue to send the sleep data. Can I use my phone during the sleep study?  Yes, you can use your phone as usual during the study. But it is recommended to put your watchpat one on when you are ready to go to bed.  How will I get my study results?  A soon as you completed your study, your sleep data will be sent to the provider. They will then share the results with you when they are ready.      Follow-Up: At [facility], you and your health needs are our priority.  As part of our continuing mission to provide you with exceptional heart care, we have created designated Provider Care Teams.  These Care Teams include your primary Cardiologist (physician) and Advanced Practice Providers (APPs -  Physician Assistants and Nurse Practitioners) who all work together to provide you with the care you need, when you need it.  We recommend signing up for  the patient portal called "MyChart".  Sign up information is provided on this After Visit Summary.  MyChart is used to connect with patients for Virtual Visits (Telemedicine).  Patients are able to view lab/test results, encounter notes, upcoming appointments, etc.  Non-urgent messages can be sent to your provider as well.   To learn more about what you can do with MyChart, go to [email or web address].    Your next appointment:   As needed  Provider:   Eleonore Chiquito, MD    Signed, Addison Naegeli. Audie Box, MD, [place]  [address] [place], [place] [address] [phone]  05/12/2022 9:42 AM

## 2022-05-12 ENCOUNTER — Encounter: Payer: Self-pay | Admitting: Cardiovascular Disease

## 2022-05-12 ENCOUNTER — Ambulatory Visit: Payer: Managed Care, Other (non HMO) | Attending: Cardiovascular Disease | Admitting: Cardiovascular Disease

## 2022-05-12 ENCOUNTER — Other Ambulatory Visit: Payer: Self-pay | Admitting: Family Medicine

## 2022-05-12 VITALS — BP 108/72 | HR 80 | Ht 72.0 in | Wt 318.4 lb

## 2022-05-12 DIAGNOSIS — R072 Precordial pain: Secondary | ICD-10-CM | POA: Diagnosis not present

## 2022-05-12 DIAGNOSIS — R5383 Other fatigue: Secondary | ICD-10-CM

## 2022-05-12 DIAGNOSIS — R0683 Snoring: Secondary | ICD-10-CM

## 2022-05-12 NOTE — Patient Instructions (Signed)
Medication Instructions:  The current medical regimen is effective;  continue present plan and medications.  *If you need a refill on your cardiac medications before your next appointment, please call your pharmacy*   Testing/Procedures:  Your physician has requested that you have an exercise tolerance test, this is a screening tool to track your fitness level. This test evaluates the your exercise capacity by measuring cardiovascular response to exercise, the stress response is induced by exercise (exercise-treadmill).  Graded exercise test is also known as maximal exercise test or stress EKG test  . Please also follow instruction sheet given.   WatchPAT?  Is a FDA cleared portable home sleep study test that uses a watch and 3 points of contact to monitor 7 different channels, including your heart rate, oxygen saturations, body position, snoring, and chest motion.  The study is easy to use from the comfort of your own home and accurately detect sleep apnea.  Before bed, you attach the chest sensor, attached the sleep apnea bracelet to your nondominant hand, and attach the finger probe.  After the study, the raw data is downloaded from the watch and scored for apnea events.   For more information: [email or web address]  Patient Testing Instructions:  Do not put battery into the device until bedtime when you are ready to begin the test. Please call the support number if you need assistance after following the instructions below: 24 hour support line- [phone] or ITAMAR support at [phone] (option 2)  Download the The First AmericanWatchPAT One" app through the google play store or App Store  Be sure to turn on or enable access to bluetooth in [place] on your smartphone/ device  Make sure no other bluetooth devices are on and within the vicinity of your smartphone/ device and WatchPAT watch during testing.  Make sure to leave your smart phone/ device plugged in and charging all  night.  When ready for bed:  Follow the instructions step by step in the WatchPAT One App to activate the testing device. For additional instructions, including video instruction, visit the WatchPAT One video on Youtube. You can search for [place] One within Youtube (video is 4 minutes and 18 seconds) or enter: https://youtube/watch?[email or web address]_vbiwxE Please note: You will be prompted to enter a Pin to connect via bluetooth when starting the test. The PIN will be assigned to you when you receive the test.  The device is disposable, but it recommended that you retain the device until you receive a call letting you know the study has been received and the results have been interpreted.  We will let you know if the study did not transmit to [place] properly after the test is completed. You do not need to call us to confirm the receipt of the test.  Please complete the test within 48 hours of receiving PIN.   Frequently Asked Questions:  What is Watch [place] one?  A single use fully disposable home sleep apnea testing device and will not need to be returned after completion.  What are the requirements to use WatchPAT one?  The be able to have a successful watchpat one sleep study, you should have your Watch pat one device, your smart phone, watch pat one app, your PIN number and Internet access What type of phone do I need?  You should have a smart phone that uses Android 5.1 and above or any Iphone with IOS 10 and above How can I download the WatchPAT one app?  Based on your device type search for  WatchPAT one app either in google play for android devices or APP store for Iphone's Where will I get my PIN for the study?  Your PIN will be provided by your physician's office. It is used for authentication and if you lose/forget your PIN, please reach out to your providers office.  I do not have Internet at home. Can I do WatchPAT one study?  WatchPAT One needs Internet connection throughout the night to be able to  transmit the sleep data. You can use your home/local internet or your cellular's data package. However, it is always recommended to use home/local Internet. It is estimated that between 20MB-30MB will be used with each study.However, the application will be looking for 80MB space in the phone to start the study.  What happens if I lose internet or bluetooth connection?  During the internet disconnection, your phone will not be able to transmit the sleep data. All the data, will be stored in your phone. As soon as the internet connection is back on, the phone will being sending the sleep data. During the bluetooth disconnection, WatchPAT one will not be able to to send the sleep data to your phone. Data will be kept in the [facility] one until two devices have bluetooth connection back on. As soon as the connection is back on, WatchPAT one will send the sleep data to the phone.  How long do I need to wear the WatchPAT one?  After you start the study, you should wear the device at least 6 hours.  How far should I keep my phone from the device?  During the night, your phone should be within 15 feet.  What happens if I leave the room for restroom or other reasons?  Leaving the room for any reason will not cause any problem. As soon as your get back to the room, both devices will reconnect and will continue to send the sleep data. Can I use my phone during the sleep study?  Yes, you can use your phone as usual during the study. But it is recommended to put your watchpat one on when you are ready to go to bed.  How will I get my study results?  A soon as you completed your study, your sleep data will be sent to the provider. They will then share the results with you when they are ready.      Follow-Up: At [facility], you and your health needs are our priority.  As part of our continuing mission to provide you with exceptional heart care, we have created designated Provider Care Teams.  These  Care Teams include your primary Cardiologist (physician) and Advanced Practice Providers (APPs -  Physician Assistants and Nurse Practitioners) who all work together to provide you with the care you need, when you need it.  We recommend signing up for the patient portal called "MyChart".  Sign up information is provided on this After Visit Summary.  MyChart is used to connect with patients for Virtual Visits (Telemedicine).  Patients are able to view lab/test results, encounter notes, upcoming appointments, etc.  Non-urgent messages can be sent to your provider as well.   To learn more about what you can do with MyChart, go to [email or web address].    Your next appointment:   As needed  Provider:   Eleonore Chiquito, MD

## 2022-05-17 ENCOUNTER — Telehealth: Payer: Self-pay

## 2022-05-17 ENCOUNTER — Telehealth: Payer: Self-pay | Admitting: *Deleted

## 2022-05-17 NOTE — Telephone Encounter (Signed)
I called the patient concerning the Itamar device, LVM to return my call.

## 2022-05-17 NOTE — Telephone Encounter (Signed)
Secure chat sent to Debria Garret ok to activate itamar device.

## 2022-05-19 ENCOUNTER — Telehealth (HOSPITAL_COMMUNITY): Payer: Self-pay | Admitting: *Deleted

## 2022-05-19 NOTE — Telephone Encounter (Signed)
Patient returned CMA's call. 

## 2022-05-19 NOTE — Telephone Encounter (Signed)
Instructions left for upcoming Appointment for ETT.  Brian Lam

## 2022-05-25 NOTE — Telephone Encounter (Signed)
I called the patient to let him know that his insurance was approved for the Itamar device and to give him the pin#. I LVM for him to give the office a call.

## 2022-05-26 ENCOUNTER — Encounter (HOSPITAL_COMMUNITY): Payer: Managed Care, Other (non HMO)

## 2022-05-27 ENCOUNTER — Telehealth (HOSPITAL_COMMUNITY): Payer: Self-pay | Admitting: Cardiovascular Disease

## 2022-05-27 NOTE — Telephone Encounter (Signed)
Patient cancelled GXT same day at 12:11 for 3:15 appointment. Patient states he had something come up at work and unable to make it. Patient did not wish to reschedule at this time due to his work schedule. Order will be removed from the WQ. If patient calls back to reschedule we will reinstate the order. Thank you.

## 2022-06-06 ENCOUNTER — Encounter: Payer: Self-pay | Admitting: Family Medicine

## 2022-06-06 ENCOUNTER — Other Ambulatory Visit: Payer: Self-pay | Admitting: Family Medicine

## 2022-06-08 ENCOUNTER — Encounter: Payer: Self-pay | Admitting: Adult Health

## 2022-06-09 NOTE — Telephone Encounter (Signed)
Spoke to pt and pt stated that his fever is gone. Pt also stated that his O2 levels are now at 93. Pt advised of provider notes and stated that he is at work. Pt stated that he will try to make it to [facility] today but if he can't he will come tomorrow.

## 2022-06-10 ENCOUNTER — Ambulatory Visit: Payer: Managed Care, Other (non HMO) | Admitting: Adult Health

## 2022-06-10 VITALS — BP 110/68 | HR 82 | Temp 98.3°F | Ht 72.0 in | Wt 317.0 lb

## 2022-06-10 DIAGNOSIS — J189 Pneumonia, unspecified organism: Secondary | ICD-10-CM | POA: Diagnosis not present

## 2022-06-10 MED ORDER — DOXYCYCLINE HYCLATE 100 MG PO CAPS: 100.0000 mg | ORAL_CAPSULE | Freq: Two times a day (BID) | ORAL | 0 refills | Status: AC

## 2022-06-10 NOTE — Progress Notes (Signed)
Subjective:    Patient ID: Brian Lam, male    DOB: 13-Jun-1985, 37 y.o.   MRN: [id number]  Pneumonia   37 year old male who  has a past medical history of Chronic knee pain, Contact dermatitis, Depression, GERD (gastroesophageal reflux disease), and Migraines.  He presents to the office today after being seen at Urgent Care 5 days ago and diagnosed with CAP of left lower lobe. He was placed on Doxycycline 100 mg BID x 7 days, prednisone 20 mg daily x 5 days and an albuterol inhaler.   Yesterday his pulse ox showed showed a reading at 89 and he stayed in the low 90's yesterday, fatigued and productive cough.   This morning when he woke up he was feeling much better than yesterday when he made his appointment. He still has a cough and feels run down but feels like his energy is coming back.   He has not had any fevers or chills since starting abx.    Review of Systems See HPI   Past Medical History:  Diagnosis Date   Chronic knee pain    Contact dermatitis    Depression    GERD (gastroesophageal reflux disease)    Migraines     Social History   Socioeconomic History   Marital status: Married    Spouse name: Not on file   Number of children: 3   Years of education: Not on file   Highest education level: Not on file  Occupational History   Occupation: Homicide Detective  Tobacco Use   Smoking status: Never   Smokeless tobacco: Never  Vaping Use   Vaping Use: Never used  Substance and Sexual Activity   Alcohol use: Yes    Comment: 2 to 3 drinks a week   Drug use: No   Sexual activity: Not on file  Other Topics Concern   Not on file  Social History Narrative   Not on file   Social Determinants of Health   Financial Resource Strain: Not on file  Food Insecurity: Not on file  Transportation Needs: Not on file  Physical Activity: Not on file  Stress: Not on file  Social Connections: Not on file  Intimate Partner Violence: Not on file    Past Surgical  History:  Procedure Laterality Date   VASECTOMY  2020    Family History  Problem Relation Age of Onset   Diabetes Father    Diabetes type II Father    Heart attack Maternal Grandfather    Diabetes Maternal Grandfather    Colon cancer Neg Hx    Esophageal cancer Neg Hx    Stomach cancer Neg Hx    Rectal cancer Neg Hx     No Known Allergies  Current Outpatient Medications on File Prior to Visit  Medication Sig Dispense Refill   albuterol (VENTOLIN HFA) 108 (90 Base) MCG/ACT inhaler      clobetasol ointment (TEMOVATE) [id number] % 1 application to affected area Externally Twice a day for 14 day(s)     desoximetasone (TOPICORT) [id number] % cream 1 application to affected area Externally Twice a day     desoximetasone (TOPICORT) 0.25 % cream Apply 1 application topically 2 (two) times daily. 30 g 2   famotidine (PEPCID) 20 MG tablet Take 20 mg by mouth at bedtime.     FLUoxetine (PROZAC) 40 MG capsule TAKE 1 CAPSULE BY MOUTH DAILY 30 capsule 0   hyoscyamine (LEVSIN SL) 0.125 MG SL tablet Place 1 tablet (0.125  mg total) under the tongue every 6 (six) hours as needed. 30 tablet 2   omeprazole (PRILOSEC) 20 MG capsule Take 1 tablet by mouth twice daily 180 capsule 0   propranolol ER (INDERAL LA) 120 MG 24 hr capsule TAKE ONE CAPSULE BY MOUTH DAILY 90 capsule 1   rizatriptan (MAXALT-MLT) 10 MG disintegrating tablet Take 1 tablet (10 mg total) by mouth as needed for migraine. May repeat in 2 hours if needed 10 tablet 5   tamsulosin (FLOMAX) 0.4 MG CAPS capsule TAKE 1 CAPSULE BY MOUTH DAILY 30 capsule 0   No current facility-administered medications on file prior to visit.    BP 110/68   Pulse 82   Temp 98.3 F (36.8 C) (Oral)   Ht 6' (1.829 m)   Wt (!) 317 lb (143.8 kg)   SpO2 96%   BMI 42.99 kg/m       Objective:   Physical Exam Vitals and nursing note reviewed.  Constitutional:      Appearance: Normal appearance. He is obese.  Pulmonary:     Effort: Pulmonary effort is normal. No  respiratory distress.     Breath sounds: Normal breath sounds. No stridor. No wheezing, rhonchi or rales.  Musculoskeletal:        General: Normal range of motion.  Skin:    General: Skin is warm and dry.     Capillary Refill: Capillary refill takes less than 2 seconds.  Neurological:     General: No focal deficit present.     Mental Status: He is alert and oriented to person, place, and time.  Psychiatric:        Mood and Affect: Mood normal.        Behavior: Behavior normal.        Thought Content: Thought content normal.        Assessment & Plan:  1. Community acquired pneumonia of left lower lobe of lung - will extend abx therapy for three additional days  - Follow up if needed - doxycycline (VIBRAMYCIN) 100 MG capsule; Take 1 capsule (100 mg total) by mouth 2 (two) times daily for 3 days.  Dispense: 6 capsule; Refill: 0  Dorothyann Peng, NP

## 2022-06-29 ENCOUNTER — Telehealth: Payer: Self-pay

## 2022-06-29 ENCOUNTER — Telehealth: Payer: Self-pay | Admitting: *Deleted

## 2022-06-29 NOTE — Telephone Encounter (Signed)
Message sent to Romie Levee to contact the patient to inquire about itamar.

## 2022-06-29 NOTE — Telephone Encounter (Signed)
Called and made the patient aware that HE may proceed with the Itamar Home Sleep Study. PIN # provided to the patient. Patient made aware that HE will be contacted after the test has been read with the results and any recommendations. Patient verbalized understanding and thanked me for the call.   

## 2022-07-10 ENCOUNTER — Other Ambulatory Visit: Payer: Self-pay | Admitting: Family Medicine

## 2022-07-13 ENCOUNTER — Telehealth: Payer: Self-pay

## 2022-07-13 NOTE — Telephone Encounter (Signed)
I called the patient to speak with him about the Itamar device, LVM to return my call.

## 2022-07-27 NOTE — Telephone Encounter (Signed)
LVM for patient to return my call about completing the Itamar device.

## 2022-08-10 ENCOUNTER — Encounter: Payer: Self-pay | Admitting: Family Medicine

## 2022-08-16 ENCOUNTER — Other Ambulatory Visit: Payer: Self-pay | Admitting: Family Medicine

## 2022-09-17 ENCOUNTER — Ambulatory Visit: Payer: Managed Care, Other (non HMO) | Admitting: Family Medicine

## 2022-09-20 ENCOUNTER — Other Ambulatory Visit: Payer: Self-pay | Admitting: Family Medicine

## 2022-09-28 ENCOUNTER — Encounter: Payer: Self-pay | Admitting: Family Medicine

## 2022-10-01 ENCOUNTER — Ambulatory Visit: Payer: Managed Care, Other (non HMO) | Admitting: Family Medicine

## 2022-10-01 VITALS — BP 114/80 | HR 94 | Temp 98.3°F | Ht 72.0 in | Wt 329.8 lb

## 2022-10-01 DIAGNOSIS — M7711 Lateral epicondylitis, right elbow: Secondary | ICD-10-CM | POA: Diagnosis not present

## 2022-10-01 MED ORDER — METHYLPREDNISOLONE ACETATE 40 MG/ML IJ SUSP
40.0000 mg | Freq: Once | INTRAMUSCULAR | Status: AC
Start: 2022-10-01 — End: 2022-10-01
  Administered 2022-10-01: 40 mg via INTRAMUSCULAR

## 2022-10-01 NOTE — Progress Notes (Unsigned)
   Established Patient Office Visit  Subjective   Patient ID: Brian Lam, male    DOB: [birth year]  Age: 37 y.o. MRN: [id number]  Chief Complaint  Patient presents with   Elbow Injury    HPI  {History (Optional):23778} Right elbow pain for several months.  NKI.   Lots of lifting recently with house move.  Pain with gripping.  Location lateral elbow.  No neck pain.  Right hand dominant.   Took some Ibuprofen without much improvement.  Past Medical History:  Diagnosis Date   Chronic knee pain    Contact dermatitis    Depression    GERD (gastroesophageal reflux disease)    Migraines    Past Surgical History:  Procedure Laterality Date   VASECTOMY  2020    reports that he has never smoked. He has never used smokeless tobacco. He reports current alcohol use. He reports that he does not use drugs. family history includes Diabetes in his father and maternal grandfather; Diabetes type II in his father; Heart attack in his maternal grandfather. No Known Allergies  Review of Systems  Neurological:  Negative for focal weakness and weakness.      Objective:     BP 114/80 (BP Location: Left Arm, Patient Position: Sitting, Cuff Size: Large)   Pulse 94   Temp 98.3 F (36.8 C) (Oral)   Ht 6' (1.829 m)   Wt (!) 329 lb 12.8 oz (149.6 kg)   SpO2 96%   BMI 44.73 kg/m  BP Readings from Last 3 Encounters:  10/01/22 114/80  06/10/22 110/68  05/12/22 108/72   Wt Readings from Last 3 Encounters:  10/01/22 (!) 329 lb 12.8 oz (149.6 kg)  06/10/22 (!) 317 lb (143.8 kg)  05/12/22 (!) 318 lb 6.4 oz (144.4 kg)      Physical Exam Vitals reviewed.  Constitutional:      Appearance: Normal appearance.  Cardiovascular:     Rate and Rhythm: Normal rate and regular rhythm.  Musculoskeletal:     Comments: Right elbow- no edema.  No erythema or warmth.  Tender over lateral epicondylar region.  Pain with wrist extension against resistance.  Full ROM.Marland Kitchen    Neurological:     Mental Status:  He is alert.      No results found for any visits on 10/01/22.  {Labs (Optional):23779}  The ASCVD Risk score (Arnett DK, et al., 2019) failed to calculate for the following reasons:   The 2019 ASCVD risk score is only valid for ages 74 to 68    Assessment & Plan:   Right lateral epicondylitis.  Several months duration.   Recommend:  -icing 20 minutes 2-3 times daily -tennis elbow strap -Consider topical Diclofenac -discussed risks and benefits of steroid injection including risk of bleeding, bruising, low risk of infection and patient wishes to proceed.  Prepped skin right lateral epicondylar region with betadine.   Injected depo 40 mg and 1 cc of plain xylocaine using 25 gauge 1 inch needle and patient tolerated well -follow up for any persistent pain.  No follow-ups on file.    Evelena Peat, MD

## 2022-10-27 ENCOUNTER — Telehealth: Payer: Self-pay

## 2022-10-27 NOTE — Telephone Encounter (Signed)
**Note De-Identified  Obfuscation** 3rd Attempt: The pt was provided a Watch-Pat One HST device at his office visit with Dr Flora Lipps on 05/12/22 per his after visit summary.  We contacted him on 3/12 and 4/16 and advised him to proceed with his HST but he did not.  We mailed him a letter on 6/17 asking him to proceed with his HST or to return the device and that if he does not, will be handing over to our billing department per the signed waiver agreement.   He did not reply to this letter.  I called the pt today but got no answer so I left a detailed message on his cell phone (Ok per DPR-Can leave detailed message on cell phone voicemail [phone]) asking him to call Larita Fife back at Dr O'Neal's office at [facility] at [phone] if he plans to proceed with his WatchPAT One-HST so I can do another PA through his ins plan to ensure coverage and that if he is not going to do the HST to please return it to the office in its original unopened box. If the pt has not replied to my VM within one month I will forward to billing on 11/26/22.  Forwarding this message to Dr Flora Lipps for ok to cancel the Itamar-HST if the pt has not done or returned by 9/13.

## 2022-11-04 ENCOUNTER — Other Ambulatory Visit: Payer: Self-pay | Admitting: Family Medicine

## 2022-11-05 ENCOUNTER — Encounter: Payer: Self-pay | Admitting: Family Medicine

## 2022-11-05 MED ORDER — FLUOXETINE HCL 40 MG PO CAPS: 40.0000 mg | ORAL_CAPSULE | Freq: Every day | ORAL | 0 refills | Status: DC

## 2022-11-05 MED ORDER — OMEPRAZOLE 20 MG PO CPDR: DELAYED_RELEASE_CAPSULE | ORAL | 0 refills | Status: DC

## 2022-11-19 ENCOUNTER — Other Ambulatory Visit: Payer: Self-pay | Admitting: Family Medicine

## 2022-11-26 ENCOUNTER — Emergency Department (HOSPITAL_BASED_OUTPATIENT_CLINIC_OR_DEPARTMENT_OTHER): Payer: Managed Care, Other (non HMO)

## 2022-11-26 ENCOUNTER — Emergency Department (HOSPITAL_BASED_OUTPATIENT_CLINIC_OR_DEPARTMENT_OTHER)
Admission: EM | Admit: 2022-11-26 | Discharge: 2022-11-26 | Disposition: A | Discharge status: Home / Self Care | Payer: Managed Care, Other (non HMO) | Attending: Emergency Medicine | Admitting: Emergency Medicine

## 2022-11-26 DIAGNOSIS — N132 Hydronephrosis with renal and ureteral calculous obstruction: Secondary | ICD-10-CM | POA: Insufficient documentation

## 2022-11-26 DIAGNOSIS — R109 Unspecified abdominal pain: Secondary | ICD-10-CM | POA: Diagnosis present

## 2022-11-26 DIAGNOSIS — N200 Calculus of kidney: Secondary | ICD-10-CM

## 2022-11-26 LAB — CBC WITH DIFFERENTIAL/PLATELET
Abs Immature Granulocytes: 0.03 10*3/uL (ref 0.00–0.07)
Basophils Absolute: 0.1 10*3/uL (ref 0.0–0.1)
Basophils Relative: 1 %
Eosinophils Absolute: 0.3 10*3/uL (ref 0.0–0.5)
Eosinophils Relative: 4 %
HCT: 39.6 % (ref 39.0–52.0)
Hemoglobin: 13.6 g/dL (ref 13.0–17.0)
Immature Granulocytes: 0 %
Lymphocytes Relative: 32 %
Lymphs Abs: 2.7 10*3/uL (ref 0.7–4.0)
MCH: 28.5 pg (ref 26.0–34.0)
MCHC: 34.3 g/dL (ref 30.0–36.0)
MCV: 83 fL (ref 80.0–100.0)
Monocytes Absolute: 0.6 10*3/uL (ref 0.1–1.0)
Monocytes Relative: 7 %
Neutro Abs: 4.8 10*3/uL (ref 1.7–7.7)
Neutrophils Relative %: 56 %
Platelets: 193 10*3/uL (ref 150–400)
RBC: 4.77 MIL/uL (ref 4.22–5.81)
RDW: 13.3 % (ref 11.5–15.5)
WBC: 8.4 10*3/uL (ref 4.0–10.5)
nRBC: 0 % (ref 0.0–0.2)

## 2022-11-26 LAB — URINALYSIS, ROUTINE W REFLEX MICROSCOPIC
Bilirubin Urine: NEGATIVE
Glucose, UA: NEGATIVE mg/dL
Ketones, ur: NEGATIVE mg/dL
Leukocytes,Ua: NEGATIVE
Nitrite: NEGATIVE
Protein, ur: NEGATIVE mg/dL
Specific Gravity, Urine: 1.025 (ref 1.005–1.030)
pH: 5.5 (ref 5.0–8.0)

## 2022-11-26 LAB — URINALYSIS, MICROSCOPIC (REFLEX)

## 2022-11-26 LAB — HEPATIC FUNCTION PANEL
ALT: 31 U/L (ref 0–44)
AST: 25 U/L (ref 15–41)
Albumin: 4.3 g/dL (ref 3.5–5.0)
Alkaline Phosphatase: 37 U/L — ABNORMAL LOW (ref 38–126)
Bilirubin, Direct: 0.1 mg/dL (ref 0.0–0.2)
Indirect Bilirubin: 0.4 mg/dL (ref 0.3–0.9)
Total Bilirubin: 0.5 mg/dL (ref 0.3–1.2)
Total Protein: 7.4 g/dL (ref 6.5–8.1)

## 2022-11-26 LAB — BASIC METABOLIC PANEL
Anion gap: 11 (ref 5–15)
BUN: 21 mg/dL — ABNORMAL HIGH (ref 6–20)
CO2: 19 mmol/L — ABNORMAL LOW (ref 22–32)
Calcium: 8.9 mg/dL (ref 8.9–10.3)
Chloride: 107 mmol/L (ref 98–111)
Creatinine, Ser: 1.41 mg/dL — ABNORMAL HIGH (ref 0.61–1.24)
GFR, Estimated: >60 mL/min (ref >60)
Glucose, Bld: 159 mg/dL — ABNORMAL HIGH (ref 70–99)
Potassium: 3.9 mmol/L (ref 3.5–5.1)
Sodium: 137 mmol/L (ref 135–145)

## 2022-11-26 LAB — LIPASE, BLOOD: Lipase: 36 U/L (ref 11–51)

## 2022-11-26 MED ORDER — FENTANYL CITRATE PF 50 MCG/ML IJ SOSY
50.0000 ug | PREFILLED_SYRINGE | Freq: Once | INTRAMUSCULAR | Status: AC
  Administered 2022-11-26: 50 ug via INTRAVENOUS
  Filled 2022-11-26: qty 1

## 2022-11-26 MED ORDER — OXYCODONE HCL 5 MG PO TABS
5.0000 mg | ORAL_TABLET | Freq: Four times a day (QID) | ORAL | 0 refills | Status: DC | PRN
Start: 2022-11-26 — End: 2023-08-10

## 2022-11-26 MED ORDER — TAMSULOSIN HCL 0.4 MG PO CAPS: 0.4000 mg | ORAL_CAPSULE | Freq: Every day | ORAL | 0 refills | Status: DC

## 2022-11-26 MED ORDER — KETOROLAC TROMETHAMINE 30 MG/ML IJ SOLN
15.0000 mg | Freq: Once | INTRAMUSCULAR | Status: AC
  Administered 2022-11-26: 15 mg via INTRAVENOUS
  Filled 2022-11-26: qty 1

## 2022-11-26 MED ORDER — ONDANSETRON HCL 4 MG/2ML IJ SOLN
4.0000 mg | Freq: Once | INTRAMUSCULAR | Status: AC
  Administered 2022-11-26: 4 mg via INTRAVENOUS
  Filled 2022-11-26: qty 2

## 2022-11-26 MED ORDER — LACTATED RINGERS IV BOLUS
1000.0000 mL | Freq: Once | INTRAVENOUS | Status: AC
  Administered 2022-11-26: 1000 mL via INTRAVENOUS

## 2022-11-26 NOTE — ED Triage Notes (Signed)
Right side flank pain since 4 am. Has progressively gotten worse, Hx of kidney stones.

## 2022-11-26 NOTE — ED Provider Notes (Signed)
[facility] Provider Note   CSN: [id number] Arrival date & time: 11/26/22  7829     History  Chief Complaint  Patient presents with   Flank Pain    Brian Lam is a 37 y.o. male.   Flank Pain  37 year old male history of Lam, Brian Lam time.  He has had multiple kidney stones before.  He had a pain in his right flank yesterday which was concerning for kidney stone but resolved on its own.  However it came back this morning and was more severe and has been persistent.  Pain is to his right flank and radiates to his right abdomen and right groin.  No testicular or scrotal pain.  ER received Lam pain medications which have caused the pain to resolve.  No fevers or chills.  He feels like he needs to urinate but has not.  1 episode of nonbloody bilious emesis.  Normal bowel movements.     Home Medications Prior to Admission medications   Medication Sig Start Date End Date Taking? Authorizing Provider  albuterol (VENTOLIN HFA) 108 (90 Base) MCG/ACT inhaler Inhale 1-2 puffs into the lungs as needed for wheezing or shortness of breath. 05/04/20  Yes [provider]  famotidine (PEPCID) 20 MG tablet Take 20 mg by mouth at bedtime.   Yes [provider]  FLUoxetine (PROZAC) 40 MG capsule Take 1 capsule (40 mg total) by mouth daily. Patient taking differently: Take 40 mg by mouth at bedtime. 11/05/22  Yes Burchette, Elberta Fortis, MD  omeprazole (PRILOSEC) 20 MG capsule Take 1 tablet by mouth twice daily Patient taking differently: Take 20 mg by mouth in the morning and at bedtime. 11/05/22  Yes Burchette, Elberta Fortis, MD  oxyCODONE (ROXICODONE) 5 MG immediate release tablet Take 1 tablet (5 mg total) by mouth every 6 (six) hours as needed for severe pain. 11/26/22  Yes Laurence Spates, MD  propranolol ER (INDERAL LA) 120 MG 24 hr capsule TAKE  1 CAPSULE BY MOUTH DAILY 11/19/22  Yes Burchette, Elberta Fortis, MD  rizatriptan (MAXALT-MLT) 10 MG disintegrating tablet Take 1 tablet (10 mg total) by mouth as needed for migraine. May repeat in 2 hours if needed 04/09/20  Yes Burchette, Elberta Fortis, MD  tamsulosin (FLOMAX) 0.4 MG CAPS capsule Take 1 capsule (0.4 mg total) by mouth daily after supper. 11/26/22  Yes Laurence Spates, MD      Allergies    Patient has no known allergies.    Review of Systems   Review of Systems  Genitourinary:  Positive for flank pain.  Review of systems completed and notable as per HPI.  ROS otherwise negative.   Physical Exam Updated Vital Signs BP 110/63 (BP Location: Right Arm)   Pulse 85   Temp 98.1 F (36.7 C) (Oral)   Resp 18   Ht 6' (1.829 m)   Wt 136.1 kg   SpO2 99%   BMI 40.69 kg/m  Physical Exam Vitals and nursing note reviewed.  Constitutional:      General: He is not in acute distress.    Appearance: He is well-developed.  HENT:     Head: Normocephalic and atraumatic.  Eyes:     Conjunctiva/sclera: Conjunctivae normal.  Cardiovascular:     Rate and Rhythm: Normal rate and regular rhythm.     Heart sounds: No murmur heard. Pulmonary:  Effort: Pulmonary effort is normal. No respiratory distress.     Breath sounds: Normal breath sounds.  Abdominal:     Palpations: Abdomen is soft.     Tenderness: There is no abdominal tenderness. There is no right CVA tenderness, left CVA tenderness, guarding or rebound.  Musculoskeletal:        General: No swelling.     Cervical back: Neck supple.  Skin:    General: Skin is warm and dry.     Capillary Refill: Capillary refill takes less than 2 seconds.  Neurological:     Mental Status: He is alert.  Psychiatric:        Mood and Affect: Mood normal.     ED Results / Procedures / Treatments   Labs (all labs ordered are listed, but only abnormal results are displayed) Labs Reviewed  URINALYSIS, ROUTINE W REFLEX MICROSCOPIC - Abnormal; Notable  for the following components:      Result Value   Hgb urine dipstick LARGE (*)    All other components within normal limits  BASIC METABOLIC PANEL - Abnormal; Notable for the following components:   CO2 19 (*)    Glucose, Bld 159 (*)    BUN 21 (*)    Creatinine, Ser 1.41 (*)    All other components within normal limits  HEPATIC FUNCTION PANEL - Abnormal; Notable for the following components:   Alkaline Phosphatase 37 (*)    All other components within normal limits  URINALYSIS, MICROSCOPIC (REFLEX) - Abnormal; Notable for the following components:   Bacteria, UA FEW (*)    All other components within normal limits  CBC WITH DIFFERENTIAL/PLATELET  LIPASE, BLOOD    EKG None  Radiology CT Renal Stone Study  Result Date: 11/26/2022 CLINICAL DATA:  Abdominal/flank pain, stone suspected R flank pain EXAM: CT ABDOMEN AND PELVIS WITHOUT CONTRAST TECHNIQUE: Multidetector CT imaging of the abdomen and pelvis was performed following the standard protocol without IV contrast. RADIATION DOSE REDUCTION: This exam was performed according to the departmental dose-optimization program which includes automated exposure control, adjustment of the mA and/or kV according to patient size and/or use of iterative reconstruction technique. COMPARISON:  None Available. FINDINGS: Inferior chest: The lung bases are well-aerated. Hepatobiliary: The liver is normal in size without focal abnormality. No intrahepatic or extrahepatic biliary ductal dilation. The gallbladder appears normal. Spleen: Normal in size without focal abnormality. Pancreas: No pancreatic ductal dilatation or surrounding inflammatory changes. Adrenals/Urinary Tract: Adrenal glands are unremarkable. There is a 5 mm stone impacted at the right ureterovesical junction. This results in moderate hydronephrosis and hydroureter. No contour deforming renal lesion. The left collecting system is within normal limits. Bladder is unremarkable. Stomach/Bowel:  The stomach, small bowel and large bowel are normal in caliber without abnormal wall thickening or surrounding inflammatory changes. The appendix is normal. Reproductive: Prostate is unremarkable. Lymphatic: No enlarged lymph nodes in the abdomen or pelvis. Vasculature: The abdominal aorta is normal in caliber. Other: No abdominopelvic ascites. Musculoskeletal: No aggressive osseous lesions. The soft tissues are unremarkable. IMPRESSION: There is a 5 mm stone impacted at the right ureterovesical junction resulting in moderate hydronephrosis. Electronically Signed   By: Olive Bass M.D.   On: 11/26/2022 08:23    Procedures Procedures    Medications Ordered in ED Medications  fentaNYL (SUBLIMAZE) injection 50 mcg (50 mcg Intravenous Given 11/26/22 0658)  ondansetron (ZOFRAN) injection 4 mg (4 mg Intravenous Given 11/26/22 0655)  ketorolac (TORADOL) 30 MG/ML injection 15 mg (15 mg Intravenous Given  11/26/22 0657)  lactated ringers bolus 1,000 mL (0 mLs Intravenous Stopped 11/26/22 0947)    ED Course/ Medical Decision Making/ A&P Clinical Course as of 11/26/22 1537  Fri Nov 26, 2022  1028 [place] [JD]    Clinical Course User Index [JD] Laurence Spates, MD                                 Medical Decision Making Amount and/or Complexity of Data Reviewed Labs: ordered. Radiology: ordered.  Risk Prescription drug management.   Medical Decision Making:   Trevian Nickol is a 37 y.o. male who presented to the ED today with right flank pain.  Vital signs reviewed.  On exam he is well-appearing, pain is actually resolved after receiving pain medication.  He has history of kidney stones and reports pain concerning for Brian.  Will evaluate for signs of infection as well and obtain CT scan.  He has no abdominal tenderness, lower concern for cholecystitis, appendicitis, obstruction.  BMP is notable for mild non-anion gap metabolic acidosis, creatinine is 1.4 from 1.2 without significant  change.   Patient placed on continuous vitals and telemetry monitoring while in ED which was reviewed periodically.  Reviewed and confirmed nursing documentation for past medical history, family history, social history.  Reassessment and Plan:   CT scan with 5 mm stone at the right UVJ.  Moderate hydronephrosis.  Discussed with Dr. Lafonda Mosses with urology.  Recommend outpatient follow-up.  Patient given resources for this.  His lab appears overall reassuring.  He is tolerant p.o., currently is pain-free.  Will give course of Flomax and pain medication.  He has no signs of infection at this time.  Strict turn precautions given.  Discharged in stable condition.   Patient's presentation is most consistent with acute complicated illness / injury requiring diagnostic workup.           Final Clinical Impression(s) / ED Diagnoses Final diagnoses:  Brian    Rx / DC Orders ED Discharge Orders          Ordered    tamsulosin (FLOMAX) 0.4 MG CAPS capsule  Daily after supper        11/26/22 1037    oxyCODONE (ROXICODONE) 5 MG immediate release tablet  Every 6 hours PRN        11/26/22 1037              Laurence Spates, MD 11/26/22 1537

## 2022-11-26 NOTE — Discharge Instructions (Signed)
Follow up with urology.  Return for fever, severe pain, decreased urination, or persistent vomiting.

## 2022-12-08 ENCOUNTER — Other Ambulatory Visit: Payer: Self-pay | Admitting: Family Medicine

## 2022-12-08 NOTE — Telephone Encounter (Signed)
**Note De-Identified Amely Voorheis Obfuscation** Sande Rives, MD sent to Macklen Wilhoite, Lorelle Formosa, LPN Caller: Unspecified (1 month ago) OK to cancel.  I have referred to billing and have removed the Itamar-HST order from the pts active orders.

## 2022-12-10 ENCOUNTER — Other Ambulatory Visit: Payer: Self-pay | Admitting: Family Medicine

## 2022-12-10 MED ORDER — FLUOXETINE HCL 40 MG PO CAPS: 40.0000 mg | ORAL_CAPSULE | Freq: Every day | ORAL | 0 refills | Status: DC

## 2023-01-07 ENCOUNTER — Other Ambulatory Visit: Payer: Self-pay

## 2023-01-07 ENCOUNTER — Other Ambulatory Visit: Payer: Self-pay | Admitting: Urology

## 2023-01-07 ENCOUNTER — Encounter (HOSPITAL_COMMUNITY): Payer: Self-pay | Admitting: Urology

## 2023-01-07 NOTE — Progress Notes (Signed)
   01/07/23 1330  OBSTRUCTIVE SLEEP APNEA  Have you ever been diagnosed with sleep apnea through a sleep study? No  Do you snore loudly (loud enough to be heard through closed doors)?  1  Do you often feel tired, fatigued, or sleepy during the daytime (such as falling asleep during driving or talking to someone)? 1  Has anyone observed you stop breathing during your sleep? 1  Do you have, or are you being treated for high blood pressure? 0  BMI more than 35 kg/m2? 1  Age > 50 (1-yes) 0  Neck circumference greater than:Male 16 inches or larger, Male 17inches or larger? 1  Male Gender (Yes=1) 1  Obstructive Sleep Apnea Score 6  Score 5 or greater  Results sent to PCP

## 2023-01-07 NOTE — Progress Notes (Addendum)
COVID Vaccine Completed: Yes  Date of COVID positive in last 90 days:  No  PCP - Evelena Peat, MD Cardiologist -  Lennie Odor, MD  Chest x-ray - N/A EKG - 05-12-22 Epic Stress Test - Ordered but not completed. ECHO - N/A Cardiac Cath - N/A Pacemaker/ICD device last checked: Spinal Cord Stimulator:N/A  Bowel Prep - N/A  Sleep Study - Ordered but not completed CPAP -   Prediabetic  Fasting Blood Sugar -  Checks Blood Sugar - does not check   Last dose of GLP1 agonist-  N/A GLP1 instructions:  N/A   Last dose of SGLT-2 inhibitors-  N/A SGLT-2 instructions: N/A  Blood Thinner Instructions:  N/A Aspirin Instructions: Last Dose:  Activity level:  Can go up a flight of stairs and perform activities of daily living without stopping and without symptoms of chest pain or shortness of breath.  Anesthesia review:  Cardiac eval for chest pain.  Stress test and sleep study ordered.  Stop Bang 6.  Chart reviewed by Christeen Douglas, PA-C.  Patient denies shortness of breath, fever, cough and chest pain at PAT appointment  Patient verbalized understanding of instructions that were given to them at the PAT appointment. Patient was also instructed that they will need to review over the PAT instructions again at home before surgery.

## 2023-01-10 ENCOUNTER — Ambulatory Visit (HOSPITAL_COMMUNITY): Payer: Managed Care, Other (non HMO) | Admitting: Anesthesiology

## 2023-01-10 ENCOUNTER — Ambulatory Visit (HOSPITAL_BASED_OUTPATIENT_CLINIC_OR_DEPARTMENT_OTHER): Payer: Managed Care, Other (non HMO) | Admitting: Anesthesiology

## 2023-01-10 ENCOUNTER — Ambulatory Visit (HOSPITAL_COMMUNITY)
Admission: RE | Admit: 2023-01-10 | Discharge: 2023-01-10 | Disposition: A | Discharge status: Home / Self Care | Payer: Managed Care, Other (non HMO) | Attending: Urology | Admitting: Urology

## 2023-01-10 ENCOUNTER — Ambulatory Visit (HOSPITAL_COMMUNITY): Payer: Managed Care, Other (non HMO)

## 2023-01-10 ENCOUNTER — Other Ambulatory Visit: Payer: Self-pay

## 2023-01-10 ENCOUNTER — Encounter (HOSPITAL_COMMUNITY): Payer: Self-pay | Admitting: Urology

## 2023-01-10 ENCOUNTER — Encounter (HOSPITAL_COMMUNITY)
Admission: RE | Disposition: A | Discharge status: Home / Self Care | Payer: Self-pay | Source: Home / Self Care | Attending: Urology

## 2023-01-10 DIAGNOSIS — Z87442 Personal history of urinary calculi: Secondary | ICD-10-CM | POA: Insufficient documentation

## 2023-01-10 DIAGNOSIS — N201 Calculus of ureter: Secondary | ICD-10-CM | POA: Diagnosis present

## 2023-01-10 DIAGNOSIS — G8929 Other chronic pain: Secondary | ICD-10-CM | POA: Diagnosis not present

## 2023-01-10 DIAGNOSIS — N289 Disorder of kidney and ureter, unspecified: Secondary | ICD-10-CM

## 2023-01-10 DIAGNOSIS — K219 Gastro-esophageal reflux disease without esophagitis: Secondary | ICD-10-CM | POA: Insufficient documentation

## 2023-01-10 HISTORY — DX: Pneumonia, unspecified organism: J18.9

## 2023-01-10 HISTORY — PX: CYSTOSCOPY/URETEROSCOPY/HOLMIUM LASER/STENT PLACEMENT: SHX6546

## 2023-01-10 HISTORY — DX: Personal history of urinary calculi: Z87.442

## 2023-01-10 HISTORY — DX: Prediabetes: R73.03

## 2023-01-10 LAB — BASIC METABOLIC PANEL WITH GFR
Anion gap: 8 (ref 5–15)
BUN: 17 mg/dL (ref 6–20)
CO2: 22 mmol/L (ref 22–32)
Calcium: 8.8 mg/dL — ABNORMAL LOW (ref 8.9–10.3)
Chloride: 109 mmol/L (ref 98–111)
Creatinine, Ser: 1.03 mg/dL (ref 0.61–1.24)
GFR, Estimated: >60 mL/min
Glucose, Bld: 91 mg/dL (ref 70–99)
Potassium: 3.9 mmol/L (ref 3.5–5.1)
Sodium: 139 mmol/L (ref 135–145)

## 2023-01-10 SURGERY — CYSTOSCOPY/URETEROSCOPY/HOLMIUM LASER/STENT PLACEMENT
Anesthesia: General | Site: Ureter | Laterality: Right

## 2023-01-10 MED ORDER — ONDANSETRON HCL 4 MG/2ML IJ SOLN
INTRAMUSCULAR | Status: AC
  Filled 2023-01-10: qty 2

## 2023-01-10 MED ORDER — ACETAMINOPHEN 10 MG/ML IV SOLN
INTRAVENOUS | Status: AC
  Filled 2023-01-10: qty 100

## 2023-01-10 MED ORDER — CHLORHEXIDINE GLUCONATE 0.12 % MT SOLN
15.0000 mL | Freq: Once | OROMUCOSAL | Status: AC
  Administered 2023-01-10: 15 mL via OROMUCOSAL

## 2023-01-10 MED ORDER — MIDAZOLAM HCL 2 MG/2ML IJ SOLN
INTRAMUSCULAR | Status: AC
  Filled 2023-01-10: qty 2

## 2023-01-10 MED ORDER — ONDANSETRON HCL 4 MG/2ML IJ SOLN
4.0000 mg | Freq: Once | INTRAMUSCULAR | Status: AC | PRN
  Administered 2023-01-10: 4 mg via INTRAVENOUS

## 2023-01-10 MED ORDER — SODIUM CHLORIDE 0.9 % IR SOLN
Status: DC | PRN
  Administered 2023-01-10: 3000 mL

## 2023-01-10 MED ORDER — ONDANSETRON HCL 4 MG/2ML IJ SOLN
INTRAMUSCULAR | Status: DC | PRN
  Administered 2023-01-10: 4 mg via INTRAVENOUS

## 2023-01-10 MED ORDER — DEXAMETHASONE SODIUM PHOSPHATE 10 MG/ML IJ SOLN
INTRAMUSCULAR | Status: DC | PRN
  Administered 2023-01-10: 10 mg via INTRAVENOUS

## 2023-01-10 MED ORDER — LIDOCAINE 2% (20 MG/ML) 5 ML SYRINGE
INTRAMUSCULAR | Status: DC | PRN
  Administered 2023-01-10: 100 mg via INTRAVENOUS

## 2023-01-10 MED ORDER — PROPOFOL 10 MG/ML IV BOLUS
INTRAVENOUS | Status: AC
  Filled 2023-01-10: qty 20

## 2023-01-10 MED ORDER — FENTANYL CITRATE (PF) 100 MCG/2ML IJ SOLN
INTRAMUSCULAR | Status: AC
  Filled 2023-01-10: qty 2

## 2023-01-10 MED ORDER — PROPOFOL 10 MG/ML IV BOLUS
INTRAVENOUS | Status: DC | PRN
  Administered 2023-01-10: 200 mg via INTRAVENOUS

## 2023-01-10 MED ORDER — OXYCODONE-ACETAMINOPHEN 5-325 MG PO TABS: 1.0000 | ORAL_TABLET | ORAL | 0 refills | Status: DC | PRN

## 2023-01-10 MED ORDER — DEXAMETHASONE SODIUM PHOSPHATE 10 MG/ML IJ SOLN
INTRAMUSCULAR | Status: AC
  Filled 2023-01-10: qty 1

## 2023-01-10 MED ORDER — OXYCODONE HCL 5 MG/5ML PO SOLN: 5.0000 mg | Freq: Once | ORAL | Status: AC | PRN

## 2023-01-10 MED ORDER — LIDOCAINE HCL (PF) 2 % IJ SOLN
INTRAMUSCULAR | Status: AC
  Filled 2023-01-10: qty 5

## 2023-01-10 MED ORDER — FENTANYL CITRATE (PF) 100 MCG/2ML IJ SOLN
INTRAMUSCULAR | Status: DC | PRN
  Administered 2023-01-10: 50 ug via INTRAVENOUS

## 2023-01-10 MED ORDER — CEFAZOLIN SODIUM-DEXTROSE 2-4 GM/100ML-% IV SOLN
2.0000 g | INTRAVENOUS | Status: AC
  Administered 2023-01-10: 2 g via INTRAVENOUS
  Filled 2023-01-10: qty 100

## 2023-01-10 MED ORDER — CEFAZOLIN SODIUM-DEXTROSE 1-4 GM/50ML-% IV SOLN
INTRAVENOUS | Status: DC | PRN
  Administered 2023-01-10: 1 g via INTRAVENOUS

## 2023-01-10 MED ORDER — FENTANYL CITRATE PF 50 MCG/ML IJ SOSY
25.0000 ug | PREFILLED_SYRINGE | INTRAMUSCULAR | Status: DC | PRN
Start: 2023-01-10 — End: 2023-01-10

## 2023-01-10 MED ORDER — STERILE WATER FOR IRRIGATION IR SOLN
Status: DC | PRN
  Administered 2023-01-10: 500 mL

## 2023-01-10 MED ORDER — EPHEDRINE SULFATE-NACL 50-0.9 MG/10ML-% IV SOSY
PREFILLED_SYRINGE | INTRAVENOUS | Status: DC | PRN
Start: 2023-01-10 — End: 2023-01-10
  Administered 2023-01-10: 10 mg via INTRAVENOUS

## 2023-01-10 MED ORDER — LACTATED RINGERS IV SOLN: INTRAVENOUS | Status: DC

## 2023-01-10 MED ORDER — PROPOFOL 10 MG/ML IV BOLUS
INTRAVENOUS | Status: AC
Start: 2023-01-10 — End: ?
  Filled 2023-01-10: qty 20

## 2023-01-10 MED ORDER — ACETAMINOPHEN 10 MG/ML IV SOLN
1000.0000 mg | Freq: Once | INTRAVENOUS | Status: DC | PRN
  Administered 2023-01-10: 1000 mg via INTRAVENOUS

## 2023-01-10 MED ORDER — EPHEDRINE 5 MG/ML INJ
INTRAVENOUS | Status: AC
  Filled 2023-01-10: qty 5

## 2023-01-10 MED ORDER — OXYCODONE HCL 5 MG PO TABS
5.0000 mg | ORAL_TABLET | Freq: Once | ORAL | Status: AC | PRN
  Administered 2023-01-10: 5 mg via ORAL

## 2023-01-10 MED ORDER — ORAL CARE MOUTH RINSE: 15.0000 mL | Freq: Once | OROMUCOSAL | Status: AC

## 2023-01-10 MED ORDER — OXYCODONE HCL 5 MG PO TABS
ORAL_TABLET | ORAL | Status: AC
  Filled 2023-01-10: qty 1

## 2023-01-10 MED ORDER — CEFAZOLIN SODIUM 1 G IJ SOLR
INTRAMUSCULAR | Status: AC
  Filled 2023-01-10: qty 10

## 2023-01-10 MED ORDER — IOHEXOL 300 MG/ML  SOLN
INTRAMUSCULAR | Status: DC | PRN
  Administered 2023-01-10: 10 mL

## 2023-01-10 MED ORDER — MIDAZOLAM HCL 5 MG/5ML IJ SOLN
INTRAMUSCULAR | Status: DC | PRN
  Administered 2023-01-10: 2 mg via INTRAVENOUS

## 2023-01-10 SURGICAL SUPPLY — 27 items
APL SKNCLS STERI-STRIP NONHPOA (GAUZE/BANDAGES/DRESSINGS)
BAG URO CATCHER STRL LF (MISCELLANEOUS) ×1 IMPLANT
BASKET ZERO TIP NITINOL 2.4FR (BASKET) IMPLANT
BENZOIN TINCTURE PRP APPL 2/3 (GAUZE/BANDAGES/DRESSINGS) IMPLANT
BSKT STON RTRVL ZERO TP 2.4FR (BASKET) ×1
CATH URETERAL DUAL LUMEN 10F (MISCELLANEOUS) IMPLANT
CATH URETL OPEN 5X70 (CATHETERS) ×1 IMPLANT
CLOTH BEACON ORANGE TIMEOUT ST (SAFETY) ×1 IMPLANT
DRSG TEGADERM 2-3/8X2-3/4 SM (GAUZE/BANDAGES/DRESSINGS) IMPLANT
FIBER LASER MOSES 200 DFL (Laser) IMPLANT
GLOVE BIOGEL M 7.0 STRL (GLOVE) ×1 IMPLANT
GOWN STRL REUS W/ TWL XL LVL3 (GOWN DISPOSABLE) ×1 IMPLANT
GOWN STRL REUS W/TWL XL LVL3 (GOWN DISPOSABLE) ×1
GUIDEWIRE STR DUAL SENSOR (WIRE) ×2 IMPLANT
GUIDEWIRE ZIPWRE .038 STRAIGHT (WIRE) IMPLANT
KIT TURNOVER KIT A (KITS) IMPLANT
LASER FIB FLEXIVA PULSE ID 365 (Laser) IMPLANT
MANIFOLD NEPTUNE II (INSTRUMENTS) ×1 IMPLANT
PACK CYSTO (CUSTOM PROCEDURE TRAY) ×1 IMPLANT
PAD PREP 24X48 CUFFED NSTRL (MISCELLANEOUS) ×1 IMPLANT
SHEATH DILATOR SET 8/10 (MISCELLANEOUS) IMPLANT
SHEATH NAVIGATOR HD 12/14X46 (SHEATH) IMPLANT
STENT URET 6FRX26 CONTOUR (STENTS) IMPLANT
TRACTIP FLEXIVA PULS ID 200XHI (Laser) IMPLANT
TRACTIP FLEXIVA PULSE ID 200 (Laser) ×1
TUBING CONNECTING 10 (TUBING) ×1 IMPLANT
TUBING UROLOGY SET (TUBING) ×1 IMPLANT

## 2023-01-10 NOTE — H&P (Signed)
Urology Preoperative H&P   Chief Complaint: Right ureteral stone  History of Present Illness: Brian Lam is a 37 y.o. male with a right ureteral stone here for right ureteroscopy with laser lithotripsy and stent placement. Urine culture negative in office last week.    Past Medical History:  Diagnosis Date   Chronic knee pain    Contact dermatitis    Depression    GERD (gastroesophageal reflux disease)    History of kidney stones    Migraines    Pneumonia    Pre-diabetes     Past Surgical History:  Procedure Laterality Date   VASECTOMY  2020    Allergies: No Known Allergies  Family History  Problem Relation Age of Onset   Diabetes Father    Diabetes type II Father    Heart attack Maternal Grandfather    Diabetes Maternal Grandfather    Colon cancer Neg Hx    Esophageal cancer Neg Hx    Stomach cancer Neg Hx    Rectal cancer Neg Hx     Social History:  reports that he has never smoked. He has never used smokeless tobacco. He reports current alcohol use. He reports that he does not use drugs.  ROS: A complete review of systems was performed.  All systems are negative except for pertinent findings as noted.  Physical Exam:  Vital signs in last 24 hours: Temp:  [97 F (36.1 C)] 97 F (36.1 C) (10/28 1055) Pulse Rate:  [72] 72 (10/28 1055) Resp:  [18] 18 (10/28 1055) BP: (131)/(68) 131/68 (10/28 1055) SpO2:  [95 %] 95 % (10/28 1055) Weight:  [136.1 kg] 136.1 kg (10/28 1102) Constitutional:  Alert and oriented, No acute distress Cardiovascular: Regular rate and rhythm Respiratory: Normal respiratory effort, Lungs clear bilaterally GI: Abdomen is soft, nontender, nondistended, no abdominal masses GU: No CVA tenderness Lymphatic: No lymphadenopathy Neurologic: Grossly intact, no focal deficits Psychiatric: Normal mood and affect  Laboratory Data:  No results for input(s): "WBC", "HGB", "HCT", "PLT" in the last 72 hours.  Recent Labs    01/10/23 1230   NA 139  K 3.9  CL 109  GLUCOSE 91  BUN 17  CALCIUM 8.8*  CREATININE 1.03     Results for orders placed or performed during the hospital encounter of 01/10/23 (from the past 24 hour(s))  Basic metabolic panel     Status: Abnormal   Collection Time: 01/10/23 12:30 PM  Result Value Ref Range   Sodium 139 135 - 145 mmol/L   Potassium 3.9 3.5 - 5.1 mmol/L   Chloride 109 98 - 111 mmol/L   CO2 22 22 - 32 mmol/L   Glucose, Bld 91 70 - 99 mg/dL   BUN 17 6 - 20 mg/dL   Creatinine, Ser 1.61 0.61 - 1.24 mg/dL   Calcium 8.8 (L) 8.9 - 10.3 mg/dL   GFR, Estimated >09 >60 mL/min   Anion gap 8 5 - 15   No results found for this or any previous visit (from the past 240 hour(s)).  Renal Function: Recent Labs    01/10/23 1230  CREATININE 1.03   Estimated Creatinine Clearance: 137.6 mL/min (by C-G formula based on SCr of 1.03 mg/dL).  Radiologic Imaging: No results found.  I independently reviewed the above imaging studies.  Assessment and Plan Brian Lam is a 37 y.o. male with a right ureteral stone here for right ureteroscopy with laser lithotripsy and stent placement.   -The risks, benefits and alternatives of cystoscopy with right  ureteroscopy with laser lithotripsy and JJ stent placement was discussed with the patient.  Risks include, but are not limited to: bleeding, urinary tract infection, ureteral injury, ureteral stricture disease, chronic pain, urinary symptoms, bladder injury, stent migration, the need for nephrostomy tube placement, MI, CVA, DVT, PE and the inherent risks with general anesthesia.  The patient voices understanding and wishes to proceed.      Matt R. Balraj Brayfield MD 01/10/2023, 1:28 PM  [facility] Pager: [phone]): [phone]

## 2023-01-10 NOTE — Anesthesia Procedure Notes (Signed)
Procedure Name: LMA Insertion Date/Time: 01/10/2023 2:04 PM  Performed by: Doran Clay, CRNAPre-anesthesia Checklist: Patient identified, Emergency Drugs available, Patient being monitored, Suction available and Timeout performed Patient Re-evaluated:Patient Re-evaluated prior to induction Oxygen Delivery Method: Circle system utilized Preoxygenation: Pre-oxygenation with 100% oxygen Induction Type: IV induction LMA: LMA inserted LMA Size: 5.0 Tube type: Oral Number of attempts: 1 Placement Confirmation: positive ETCO2 and breath sounds checked- equal and bilateral Tube secured with: Tape Dental Injury: Teeth and Oropharynx as per pre-operative assessment

## 2023-01-10 NOTE — Discharge Instructions (Addendum)
[facility] [phone] Post Ureteroscopy With or Without Stent Instructions  Definitions:  Ureter: The duct that transports urine from the kidney to the bladder. Stent:   A plastic hollow tube that is placed into the ureter, from the kidney to the bladder to prevent the ureter from swelling shut.  GENERAL INSTRUCTIONS:  Despite the fact that no skin incisions were used, the area around the ureter and bladder is raw and irritated. The stent is a foreign body which will further irritate the bladder wall. This irritation is manifested by increased frequency of urination, both day and night, and by an increase in the urge to urinate. In some, the urge to urinate is present almost always. Sometimes the urge is strong enough that you may not be able to stop yourself from urinating. The only real cure is to remove the stent and then give time for the bladder wall to heal which can't be done until the danger of the ureter swelling shut has passed, which varies.  You may see some blood in your urine while the stent is in place and a few days afterwards. Do not be alarmed, even if the urine was clear for a while. Get off your feet and drink lots of fluids until clearing occurs. If you start to pass clots or don't improve, call us.  DIET: You may return to your normal diet immediately. Because of the raw surface of your bladder, alcohol, spicy foods, acid type foods and drinks with caffeine may cause irritation or frequency and should be used in moderation. To keep your urine flowing freely and to avoid constipation, drink plenty of fluids during the day ( 8-10 glasses ). Tip: Avoid cranberry juice because it is very acidic.  ACTIVITY: Your physical activity doesn't need to be restricted. However, if you are very active, you may see some blood in your urine. We suggest that you reduce your activity under these circumstances until the bleeding has stopped.  BOWELS: It is important to  keep your bowels regular during the postoperative period. Straining with bowel movements can cause bleeding. A bowel movement every other day is reasonable. Use a mild laxative if needed, such as Milk of Magnesia 2-3 tablespoons, or 2 Dulcolax tablets. Call if you continue to have problems. If you have been taking narcotics for pain, before, during or after your surgery, you may be constipated. Take a laxative if necessary.   MEDICATION: You should resume your pre-surgery medications unless told not to. In addition you will often be given an antibiotic to prevent infection. These should be taken as prescribed until the bottles are finished unless you are having an unusual reaction to one of the drugs.  PROBLEMS YOU SHOULD REPORT TO [place]: Fevers over 100.5 Fahrenheit. Heavy bleeding, or clots ( See above notes about blood in urine ). Inability to urinate. Drug reactions ( hives, rash, nausea, vomiting, diarrhea ). Severe burning or pain with urination that is not improving.  FOLLOW-UP: You will need a follow-up appointment to monitor your progress. Call for this appointment at the number listed above. Usually the first appointment will be about three to fourteen days after your surgery.  Remove the stent by pulling attached string on Thursday morning.

## 2023-01-10 NOTE — Op Note (Signed)
Operative Note  Preoperative diagnosis:  1.  Right ureteral stone  Postoperative diagnosis: 1.  Right ureteral stone  Procedure(s): 1.  Cystoscopy 2. Right ureteroscopy with laser lithotripsy and basket extraction of stones 3. Right retrograde pyelogram 4. Right ureteral stent placement 5. Fluoroscopy with intraoperative interpretation  Surgeon: Jettie Pagan, MD  Assistants:  None  Anesthesia:  General  Complications:  None  EBL:  Minimal  Specimens: 1. Stones for stone analysis (to be done at [facility])  Drains/Catheters: 1.  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 Right ureteroscopy demonstrated 5 mm distal right ureteral stone with narrowed distal right ureter.  This was successfully fragmented and extracted in 3 several small pieces.  Right retrograde pyelogram with no hydronephrosis. Successful stent placement.  Indication:  Brian Lam is a 37 y.o. male with history of a distal right ureteral stone who presents today for definitive treatment.  Description of procedure: After informed consent was obtained from the patient, the patient was identified and taken to the operating room and placed in the supine position.  General anesthesia was administered as well as perioperative IV antibiotics.  At the beginning of the case, a time-out was performed to properly identify the patient, the surgery to be performed, and the surgical site.  Sequential compression devices were applied to the lower extremities at the beginning of the case for DVT prophylaxis.  The patient was then placed in the dorsal lithotomy supine position, prepped and draped in sterile fashion.  Preliminary scout fluoroscopy revealed that there was a 5 mm calcification area at the distal right ureter, which corresponds to the  stone found on the  preoperative CT scan. We then passed the 21-French rigid cystoscope through the urethra and into the bladder under vision without any difficulty , noting a normal urethra with approximately 18 French bulbar urethral wide caliber stenosis, easily passable with scope and obstructing prostate.  A systematic evaluation of the bladder revealed no evidence of any suspicious bladder lesions.  Ureteral orifices were in normal position.    Under cystoscopic and flouroscopic guidance, we cannulated the right ureteral orifice with a 5-French open-ended ureteral catheter and a gentle retrograde pyelogram was performed, revealing a normal caliber ureter without any filling defects. There was no hydronephrosis of the collecting system. A 0.038 sensor wire was then passed up to the level of the renal pelvis and secured to the drape as a safety wire. The ureteral catheter and cystoscope were removed, leaving the safety wire in place.   A semi-rigid ureteroscope was passed alongside the wire up the distal ureter which appeared slightly stenotic.  We encountered a 5 mm distal right ureteral stone in the distal right ureter.  A second 0.038 sensor wire was passed under direct vision and the semirigid scope was removed.  Semirigid scope was then introduced and 200 m holmium laser fiber was used to fragment the stone in 3 separate pieces.  Next, 0 tip basket used to remove these pieces atraumatically.  Semirigid ureteroscope was reintroduced and there is no stone fragments remaining.  Right retrograde pyelogram demonstrated no extravasation of contrast, no filling defects, no hydronephrosis.  Once the ureteroscope was removed, the Glidewire was backloaded through the rigid cystoscope, which was then advanced down the urethra and into the bladder. We then used the Glidewire under direct vision through the rigid cystoscope and under fluoroscopic guidance  and passed up a 6-French, 26 cm double-pigtail ureteral stent up ureter,  making sure that the proximal and distal ends coiled within the kidney and bladder respectively.  Note that we left a long tether string attached to the distal end of the ureteral stent and it exited the urethral meatus and was secured to the penile shaft with a tegaderm adhesive.  The cystoscope was then advanced back into the bladder under vision.  We were able to see the distal stent coiling nicely within the bladder.  The bladder was then emptied with irrigation solution.  The cystoscope was then removed.    The patient tolerated the procedure well and there was no complication. Patient was awoken from anesthesia and taken to the recovery room in stable condition. I was present and scrubbed for the entirety of the case.  Plan:  Patient will be discharged home.  He will remove his stent in 3 days and follow-up in the office in 1 month with renal ultrasound prior.   Brian R. Raphael Fitzpatrick MD [facility]  Pager: [phone]

## 2023-01-10 NOTE — Transfer of Care (Signed)
Immediate Anesthesia Transfer of Care Note  Patient: Brian Lam  Procedure(s) Performed: CYSTOSCOPY, RIGHT RETROGRADE PYELOGRAM, RIGHT URETEROSCOPY, HOLMIUM LASER LITHOTRIPSY, AND RIGHT URETERAL STENT PLACEMENT (Right: Ureter)  Patient Location: PACU  Anesthesia Type:General  Level of Consciousness: sedated  Airway & Oxygen Therapy: Patient Spontanous Breathing and Patient connected to face mask oxygen  Post-op Assessment: Report given to RN and Post -op Vital signs reviewed and stable  Post vital signs: Reviewed and stable  Last Vitals:  Vitals Value Taken Time  BP 133/77 01/10/23 1505  Temp    Pulse 76 01/10/23 1507  Resp    SpO2 98 % 01/10/23 1507  Vitals shown include unfiled device data.  Last Pain:  Vitals:   01/10/23 1055  TempSrc: Oral         Complications: No notable events documented.

## 2023-01-10 NOTE — Anesthesia Preprocedure Evaluation (Signed)
Anesthesia Evaluation  Patient identified by MRN, date of birth, ID band Patient awake    Reviewed: Allergy & Precautions, NPO status , Patient's Chart, lab work & pertinent test results, reviewed documented beta blocker date and time   History of Anesthesia Complications Negative for: history of anesthetic complications  Airway Mallampati: III  TM Distance: >3 FB Neck ROM: Full    Dental no notable dental hx.    Pulmonary neg shortness of breath, sleep apnea (suspect) , pneumonia, neg recent URI, neg PE   breath sounds clear to auscultation       Cardiovascular (-) hypertension(-) CAD, (-) Past MI, (-) Cardiac Stents and (-) CABG  Rhythm:Regular Rate:Normal     Neuro/Psych  Headaches, neg Seizures PSYCHIATRIC DISORDERS  Depression       GI/Hepatic ,GERD  ,,(+) neg Cirrhosis        Endo/Other    Renal/GU Renal disease     Musculoskeletal   Abdominal   Peds  Hematology   Anesthesia Other Findings   Reproductive/Obstetrics                              Anesthesia Physical Anesthesia Plan  ASA: 2  Anesthesia Plan: General   Post-op Pain Management:    Induction: Intravenous  PONV Risk Score and Plan: 2 and Ondansetron and Dexamethasone  Airway Management Planned: LMA  Additional Equipment:   Intra-op Plan:   Post-operative Plan: Extubation in OR  Informed Consent: I have reviewed the patients History and Physical, chart, labs and discussed the procedure including the risks, benefits and alternatives for the proposed anesthesia with the patient or authorized representative who has indicated his/her understanding and acceptance.     Dental advisory given  Plan Discussed with: CRNA  Anesthesia Plan Comments:          Anesthesia Quick Evaluation

## 2023-01-11 ENCOUNTER — Encounter (HOSPITAL_COMMUNITY): Payer: Self-pay | Admitting: Urology

## 2023-01-12 NOTE — Anesthesia Postprocedure Evaluation (Signed)
Anesthesia Post Note  Patient: Brian Lam  Procedure(s) Performed: CYSTOSCOPY, RIGHT RETROGRADE PYELOGRAM, RIGHT URETEROSCOPY, HOLMIUM LASER LITHOTRIPSY, AND RIGHT URETERAL STENT PLACEMENT (Right: Ureter)     Patient location during evaluation: PACU Anesthesia Type: General Level of consciousness: awake and alert Pain management: pain level controlled Vital Signs Assessment: post-procedure vital signs reviewed and stable Respiratory status: spontaneous breathing, nonlabored ventilation, respiratory function stable and patient connected to nasal cannula oxygen Cardiovascular status: blood pressure returned to baseline and stable Postop Assessment: no apparent nausea or vomiting Anesthetic complications: no   No notable events documented.  Last Vitals:  Vitals:   01/10/23 1545 01/10/23 1600  BP: 134/75 122/75  Pulse: 73 70  Resp: 16   Temp: 36.8 C   SpO2: 95% 92%    Last Pain:  Vitals:   01/10/23 1600  TempSrc:   PainSc: 4                  Mariann Barter

## 2023-01-17 ENCOUNTER — Other Ambulatory Visit: Payer: Self-pay | Admitting: Family Medicine

## 2023-01-18 MED ORDER — FLUOXETINE HCL 40 MG PO CAPS: 40.0000 mg | ORAL_CAPSULE | Freq: Every day | ORAL | 0 refills | Status: DC

## 2023-01-19 ENCOUNTER — Encounter: Payer: Self-pay | Admitting: Family Medicine

## 2023-01-19 NOTE — Telephone Encounter (Signed)
Noted  

## 2023-02-08 ENCOUNTER — Ambulatory Visit (INDEPENDENT_AMBULATORY_CARE_PROVIDER_SITE_OTHER): Payer: Managed Care, Other (non HMO) | Admitting: Family Medicine

## 2023-02-08 ENCOUNTER — Encounter: Payer: Self-pay | Admitting: Family Medicine

## 2023-02-08 VITALS — BP 114/72 | HR 88 | Temp 98.1°F | Ht 72.0 in | Wt 335.1 lb

## 2023-02-08 DIAGNOSIS — M7711 Lateral epicondylitis, right elbow: Secondary | ICD-10-CM | POA: Diagnosis not present

## 2023-02-08 MED ORDER — METHYLPREDNISOLONE ACETATE 40 MG/ML IJ SUSP
40.0000 mg | Freq: Once | INTRAMUSCULAR | Status: AC
  Administered 2023-02-08: 40 mg via INTRA_ARTICULAR

## 2023-02-08 NOTE — Progress Notes (Signed)
Established Patient Office Visit  Subjective   Patient ID: Brian Lam, male    DOB: 14-Dec-1985  Age: 37 y.o. MRN: [id number]  Chief Complaint  Patient presents with   Elbow Pain    Patient complains of right elbow pain    HPI   Brian Lam is seen for right lateral elbow pain.  Was seen in July for similar and diagnosed with lateral epicondylitis.  Received injection of steroid at that time and pain was fully resolved about 4 weeks ago.  Currently he is building a office within his garage and has done a lot of hammering and use of his upper extremities with that.  Denies any specific injury.  Pain worse with gripping.  Has not tried any icing.  He is right-hand dominant  Past Medical History:  Diagnosis Date   Chronic knee pain    Contact dermatitis    Depression    GERD (gastroesophageal reflux disease)    History of kidney stones    Migraines    Pneumonia    Pre-diabetes    Past Surgical History:  Procedure Laterality Date   CYSTOSCOPY/URETEROSCOPY/HOLMIUM LASER/STENT PLACEMENT Right 01/10/2023   Procedure: CYSTOSCOPY, RIGHT RETROGRADE PYELOGRAM, RIGHT URETEROSCOPY, HOLMIUM LASER LITHOTRIPSY, AND RIGHT URETERAL STENT PLACEMENT;  Surgeon: Jannifer Hick, MD;  Location: [facility];  Service: Urology;  Laterality: Right;  60 MINUTES   VASECTOMY  2020    reports that he has never smoked. He has never used smokeless tobacco. He reports current alcohol use. He reports that he does not use drugs. family history includes Diabetes in his father and maternal grandfather; Diabetes type II in his father; Heart attack in his maternal grandfather. No Known Allergies  Review of Systems  Neurological:  Negative for sensory change and focal weakness.      Objective:     BP 114/72 (BP Location: Left Arm, Patient Position: Sitting, Cuff Size: Large)   Pulse 88   Temp 98.1 F (36.7 C) (Oral)   Ht 6' (1.829 m)   Wt (!) 335 lb 1.6 oz (152 kg)   SpO2 97%   BMI 45.45 kg/m  BP Readings from  Last 3 Encounters:  02/08/23 114/72  01/10/23 122/75  11/26/22 110/63   Wt Readings from Last 3 Encounters:  02/08/23 (!) 335 lb 1.6 oz (152 kg)  01/10/23 300 lb (136.1 kg)  11/26/22 300 lb (136.1 kg)      Physical Exam Vitals reviewed.  Constitutional:      General: He is not in acute distress.    Appearance: He is not ill-appearing.  Cardiovascular:     Rate and Rhythm: Normal rate and regular rhythm.  Musculoskeletal:     Comments: Elbow reveals no visible swelling.  No erythema.  Full range of motion.  Pain with wrist extension against resistance.  He has localized tenderness over the lateral epicondylar region  Neurological:     Mental Status: He is alert.      No results found for any visits on 02/08/23.    The ASCVD Risk score (Arnett DK, et al., 2019) failed to calculate for the following reasons:   The 2019 ASCVD risk score is only valid for ages 5 to 45    Assessment & Plan:   Problem List Items Addressed This Visit   None Visit Diagnoses     Right lateral epicondylitis    -  Primary   Relevant Medications   methylPREDNISolone acetate (DEPO-MEDROL) injection 40 mg (Completed)  Recurrent right lateral epicondylitis.  He has had several weeks now with pain.  Improved previously with steroid injection.  We have recommended some icing.  Also recommend some rehab exercises.  We did offer repeat injection with steroid after reviewing risk and benefits including potential risk including low risk of infection, bruising, bleeding.  Patient consented.  Prepped skin with Betadine.  Using 25-gauge 1 inch needle injected 40 mg Depo-Medrol and 1 cc of plain Xylocaine.  Patient tolerated well.  Consider formal physical therapy if not improving over the next couple of weeks  No follow-ups on file.    Evelena Peat, MD

## 2023-02-14 ENCOUNTER — Other Ambulatory Visit: Payer: Self-pay | Admitting: Family Medicine

## 2023-02-26 ENCOUNTER — Other Ambulatory Visit: Payer: Self-pay | Admitting: Family Medicine

## 2023-02-28 ENCOUNTER — Other Ambulatory Visit: Payer: Self-pay

## 2023-02-28 ENCOUNTER — Other Ambulatory Visit (HOSPITAL_COMMUNITY): Payer: Self-pay

## 2023-02-28 MED ORDER — PROPRANOLOL HCL ER 120 MG PO CP24
120.0000 mg | ORAL_CAPSULE | Freq: Every day | ORAL | 0 refills | Status: DC
  Filled 2023-02-28: qty 90, 90d supply, fill #0

## 2023-03-06 ENCOUNTER — Encounter: Payer: Self-pay | Admitting: Family Medicine

## 2023-03-06 DIAGNOSIS — M7711 Lateral epicondylitis, right elbow: Secondary | ICD-10-CM

## 2023-03-13 ENCOUNTER — Encounter (INDEPENDENT_AMBULATORY_CARE_PROVIDER_SITE_OTHER): Payer: Self-pay | Admitting: Cardiology

## 2023-03-13 DIAGNOSIS — G4733 Obstructive sleep apnea (adult) (pediatric): Secondary | ICD-10-CM | POA: Diagnosis not present

## 2023-03-14 ENCOUNTER — Encounter: Payer: Self-pay | Admitting: Cardiovascular Disease

## 2023-03-14 ENCOUNTER — Ambulatory Visit: Payer: Managed Care, Other (non HMO) | Attending: Cardiovascular Disease

## 2023-03-14 DIAGNOSIS — R5383 Other fatigue: Secondary | ICD-10-CM

## 2023-03-14 DIAGNOSIS — R0683 Snoring: Secondary | ICD-10-CM

## 2023-03-14 NOTE — Procedures (Unsigned)
   SLEEP STUDY REPORT Patient Information Study Date: 03/13/2023 Patient Name: Brian Lam Patient ID: [id number] Birth Date: [birth year] Age: 37 Gender: Male BMI: 43.0 (W=317 lb, H=6' 0'') Referring Physician: Lennie Odor, MD  TEST DESCRIPTION: Home sleep apnea testing was completed using the WatchPat, a Type 1 device, utilizing peripheral arterial tonometry (PAT), chest movement, actigraphy, pulse oximetry, pulse rate, body position and snore. AHI was calculated with apnea and hypopnea using valid sleep time as the denominator. RDI includes apneas, hypopneas, and RERAs. The data acquired and the scoring of sleep and all associated events were performed in accordance with the recommended standards and specifications as outlined in the AASM Manual for the Scoring of Sleep and Associated Events 2.2.0 (2015).  FINDINGS:  1. Severe Obstructive Sleep Apnea with AHI 57.7/hr.  2. No Central Sleep Apnea with pAHIc 2.9/hr.  3. Oxygen desaturations as low as 79%.  4. Severe snoring was present. O2 sats were < 88% for 22.6 min.  5. Total sleep time was 6 hrs and 29 min.  6. 25.8% of total sleep time was spent in REM sleep.  7. Shortened sleep onset latency at 6 min.  8. Shortened REM sleep onset latency at 57 min.  9. Total awakenings were 11. 10. Arrhythmia detection: None  DIAGNOSIS: Severe Obstructive Sleep Apnea (G47.33) Nocturnal Hypoxemia  RECOMMENDATIONS: 1. Clinical correlation of these findings is necessary. The decision to treat obstructive sleep apnea (OSA) is usually based on the presence of apnea symptoms or the presence of associated medical conditions such as Hypertension, Congestive Heart Failure, Atrial Fibrillation or Obesity. The most common symptoms of OSA are snoring, gasping for breath while sleeping, daytime sleepiness and fatigue.  2. Initiating apnea therapy is recommended given the presence of symptoms and/or associated conditions. Recommend proceeding  with one of the following:   a. Auto-CPAP therapy with a pressure range of 5-20cm H2O.   b. An oral appliance (OA) that can be obtained from certain dentists with expertise in sleep medicine. These are primarily of use in non-obese patients with mild and moderate disease.   c. An ENT consultation which may be useful to look for specific causes of obstruction and possible treatment options.   d. If patient is intolerant to PAP therapy, consider referral to ENT for evaluation for hypoglossal nerve stimulator.  3. Close follow-up is necessary to ensure success with CPAP or oral appliance therapy for maximum benefit .  4. A follow-up oximetry study on CPAP is recommended to assess the adequacy of therapy and determine the need for supplemental oxygen or the potential need for Bi-level therapy. An arterial blood gas to determine the adequacy of baseline ventilation and oxygenation should also be considered.  5. Healthy sleep recommendations include: adequate nightly sleep (normal 7-9 hrs/night), avoidance of caffeine after noon and alcohol near bedtime, and maintaining a sleep environment that is cool, dark and quiet.  6. Weight loss for overweight patients is recommended. Even modest amounts of weight loss can significantly improve the severity of sleep apnea.  7. Snoring recommendations include: weight loss where appropriate, side sleeping, and avoidance of alcohol before bed.  8. Operation of motor vehicle should be avoided when sleepy.  Signature: Armanda Magic, MD; [facility]; Diplomat, American Board of Sleep Medicine Electronically Signed: 03/14/2023 8:26:23 AM

## 2023-03-17 ENCOUNTER — Telehealth: Payer: Self-pay

## 2023-03-17 DIAGNOSIS — G4733 Obstructive sleep apnea (adult) (pediatric): Secondary | ICD-10-CM

## 2023-03-17 NOTE — Telephone Encounter (Signed)
-----   Message from Brian Lam sent at 03/14/2023  8:28 AM EST ----- Please let patient know that they have sleep apnea.  Recommend therapeutic CPAP titration for treatment of patient's sleep disordered breathing.  If unable to perform an in lab titration then initiate ResMed auto CPAP from 4 to 15cm H2O with heated humidity and mask of choice and overnight pulse ox on CPAP.

## 2023-03-17 NOTE — Telephone Encounter (Signed)
 Left VM with callback number for sleep study results and recommendations.

## 2023-03-18 NOTE — Telephone Encounter (Signed)
Patient returned call, Notified patient of sleep study results and recommendations. All questions were answered and patient verbalized understanding. CPAP Titration ordered today.

## 2023-03-18 NOTE — Addendum Note (Signed)
 Addended by: Brunetta Genera on: 03/18/2023 09:57 AM   Modules accepted: Orders

## 2023-03-23 ENCOUNTER — Other Ambulatory Visit: Payer: Self-pay | Admitting: Family Medicine

## 2023-03-24 ENCOUNTER — Other Ambulatory Visit (HOSPITAL_BASED_OUTPATIENT_CLINIC_OR_DEPARTMENT_OTHER): Payer: Self-pay

## 2023-03-24 ENCOUNTER — Other Ambulatory Visit: Payer: Self-pay

## 2023-03-24 MED ORDER — FLUOXETINE HCL 40 MG PO CAPS
40.0000 mg | ORAL_CAPSULE | Freq: Every day | ORAL | 0 refills | Status: DC
  Filled 2023-03-24: qty 30, 30d supply, fill #0

## 2023-03-29 ENCOUNTER — Other Ambulatory Visit: Payer: Self-pay

## 2023-04-03 ENCOUNTER — Encounter: Payer: Self-pay | Admitting: Family Medicine

## 2023-04-04 MED ORDER — FLUOXETINE HCL 40 MG PO CAPS: 40.0000 mg | ORAL_CAPSULE | Freq: Every day | ORAL | 0 refills | Status: DC

## 2023-05-09 ENCOUNTER — Other Ambulatory Visit: Payer: Self-pay | Admitting: Family Medicine

## 2023-05-10 ENCOUNTER — Encounter: Payer: Self-pay | Admitting: Cardiovascular Disease

## 2023-05-10 ENCOUNTER — Encounter: Payer: Self-pay | Admitting: Family Medicine

## 2023-05-11 MED ORDER — FLUOXETINE HCL 40 MG PO CAPS: 40.0000 mg | ORAL_CAPSULE | Freq: Every day | ORAL | 0 refills | Status: DC

## 2023-05-14 ENCOUNTER — Encounter: Payer: Self-pay | Admitting: Family Medicine

## 2023-05-16 ENCOUNTER — Telehealth: Payer: Self-pay

## 2023-05-16 DIAGNOSIS — G4733 Obstructive sleep apnea (adult) (pediatric): Secondary | ICD-10-CM

## 2023-05-16 DIAGNOSIS — R0683 Snoring: Secondary | ICD-10-CM

## 2023-05-16 NOTE — Telephone Encounter (Signed)
**Note De-Identified [place] [facility] Obfuscation** I called [employer] and started a CPAP Titration PA with Brianna. Case #: [id number]  Per Colin Mulders, I did fax all clinicals to them at [phone] and I did receive confirmation that the fax sent successfully.  PA is now pending review.

## 2023-05-17 ENCOUNTER — Encounter (INDEPENDENT_AMBULATORY_CARE_PROVIDER_SITE_OTHER): Payer: Self-pay | Admitting: Family Medicine

## 2023-05-17 ENCOUNTER — Encounter: Payer: Self-pay | Admitting: Family Medicine

## 2023-05-17 ENCOUNTER — Ambulatory Visit (INDEPENDENT_AMBULATORY_CARE_PROVIDER_SITE_OTHER): Payer: Self-pay | Admitting: Family Medicine

## 2023-05-17 VITALS — BP 116/71 | HR 73 | Temp 98.9°F | Ht 72.0 in | Wt 335.0 lb

## 2023-05-17 DIAGNOSIS — G4733 Obstructive sleep apnea (adult) (pediatric): Secondary | ICD-10-CM

## 2023-05-17 DIAGNOSIS — R7303 Prediabetes: Secondary | ICD-10-CM | POA: Diagnosis not present

## 2023-05-17 DIAGNOSIS — Z0289 Encounter for other administrative examinations: Secondary | ICD-10-CM

## 2023-05-17 DIAGNOSIS — K219 Gastro-esophageal reflux disease without esophagitis: Secondary | ICD-10-CM | POA: Diagnosis not present

## 2023-05-17 DIAGNOSIS — Z6841 Body Mass Index (BMI) 40.0 and over, adult: Secondary | ICD-10-CM

## 2023-05-17 NOTE — Progress Notes (Signed)
 [name], DO, ABFM, ABOM Bariatric physician [address] [place], [place], [place] [address] Office: [phone]  /  Fax: [phone]    Initial Evaluation:  Brian Lam was seen in clinic today to evaluate for obesity. He is interested in losing weight to improve overall health and reduce the risk of weight related complications. He presents today to review program treatment options, initial physical assessment, and evaluation.     He was referred by: Self-Referral  When asked how has your weight affected you? He states: Contributed to medical problems, Having poor endurance, and Has affected mood   Contributing factors to his weight change: Family history of obesity (both parents + brother had gastric bypass in the past), Moderate to high levels of stress, Reduced physical activity, and eating patterns.  Some associated conditions: OSA, Prediabetes, and GERD  Current nutrition plan: None  Current level of physical activity: None  Current or previous pharmacotherapy: See Pre-DM note.  Response to medication: See Pre-DM note.   Past Medical History:  Diagnosis Date   Chronic knee pain    Contact dermatitis    Depression    GERD (gastroesophageal reflux disease)    History of kidney stones    Migraines    Pneumonia    Pre-diabetes     Current Outpatient Medications  Medication Instructions   albuterol (VENTOLIN HFA) 108 (90 Base) MCG/ACT inhaler 1-2 puffs, As needed   famotidine (PEPCID) 20 mg, Daily at bedtime   FLUoxetine (PROZAC) 40 mg, Oral, Daily   omeprazole (PRILOSEC) 20 MG capsule Take 1 tablet by mouth twice daily   oxyCODONE (ROXICODONE) 5 mg, Oral, Every 6 hours PRN   oxyCODONE-acetaminophen (PERCOCET) 5-325 MG tablet 1 tablet, Oral, Every 4 hours PRN   propranolol ER (INDERAL LA) 120 mg, Oral, Daily   rizatriptan (MAXALT-MLT) 10 mg, Oral, As needed, May repeat in 2 hours if needed   tamsulosin (FLOMAX) 0.4 mg, Oral, Daily after supper    No  Known Allergies   Past Surgical History:  Procedure Laterality Date   CYSTOSCOPY/URETEROSCOPY/HOLMIUM LASER/STENT PLACEMENT Right 01/10/2023   Procedure: CYSTOSCOPY, RIGHT RETROGRADE PYELOGRAM, RIGHT URETEROSCOPY, HOLMIUM LASER LITHOTRIPSY, AND RIGHT URETERAL STENT PLACEMENT;  Surgeon: Jannifer Hick, MD;  Location: [facility];  Service: Urology;  Laterality: Right;  60 MINUTES   VASECTOMY  2020     Family History  Problem Relation Age of Onset   Diabetes Father    Diabetes type II Father    Heart attack Maternal Grandfather    Diabetes Maternal Grandfather    Colon cancer Neg Hx    Esophageal cancer Neg Hx    Stomach cancer Neg Hx    Rectal cancer Neg Hx      Objective:  BP 116/71   Pulse 73   Temp 98.9 F (37.2 C)   Ht 6' (1.829 m)   Wt (!) 335 lb (152 kg)   SpO2 100%   BMI 45.43 kg/m  He was weighed on the bioimpedance scale: Body mass index is 45.43 kg/m.  Visceral Fat %: 24 , Body Fat %: 38.9  Weight Lost Since Last Visit: N/A  Weight Gained Since Last Visit: N/A   Vitals Temp: 98.9 F (37.2 C) BP: 116/71 Pulse Rate: 73 SpO2: 100 %   Anthropometric Measurements Height: 6' (1.829 m) Weight: (!) 335 lb (152 kg) BMI (Calculated): 45.42 Weight at Last Visit: N/A Weight Lost Since Last Visit: N/A Weight Gained Since Last Visit: N/A Starting Weight: N/A   Body Composition  Body Fat %: 38.9 % Fat Mass (lbs): 130.6 lbs Muscle Mass (lbs): 195 lbs Total Body Water (lbs): 153.4 lbs Visceral Fat Rating : 24   Other Clinical Data Fasting: No Labs: No Today's Visit #: Info Session Comments: Information Session    General: Well Developed, well nourished, and in no acute distress.  HEENT: Normocephalic, atraumatic; EOMI, sclerae are anicteric. Skin: Warm and dry, good turgor Chest:  Normal excursion, shape, no gross ABN Respiratory: No conversational dyspnea; speaking in full sentences NeuroM-Sk:  Normal gross ROM * 4 extremities  Psych: A and O *3,  insight adequate, mood- full   Assessment and Plan:   FOR THE DISEASE OF OBESITY:  Morbid obesity (HCC) Assessment & Plan: We reviewed anthropometrics, biometrics, associated medical conditions and contributing factors with patient.   Brian Lam  would benefit from a medically tailored reduced calorie nutrional plan based on his REE (resting energy expenditure), which will be determined by indirect calorimetry.  We will also assess for cardiometabolic risk and nutritional derangements via fasting labs at intake appointment.    Obesity Treatment / Action Plan:   he was weighed on the bioimpedance scale and results were discussed and documented in the synopsis.   [name] will complete provided nutritional and psychosocial assessment questionnaire before the next appointment.  he will be scheduled for indirect calorimetry to determine resting energy expenditure in a fasting state.  This will allow [place] to create a reduced calorie, high-protein meal plan to promote loss of fat mass while preserving muscle mass.  We will also assess for cardiometabolic risk and nutritional derangements via an ECG and fasting serologies at his next appointment.  he was encouraged to work on amassing support from family and friends to begin their weight loss journey.   Work on eliminating or reducing the presence of highly processed, poorly nutritious, calorie-dense foods in the home.   Obesity Education Performed Today:  Patient was counseled on nutritional approaches to weight loss and benefits of reducing processed foods and consuming plant-based foods and high quality protein as part of nutritional weight management program.   We discussed the importance of long term lifestyle changes which include nutrition, exercise and behavioral modifications as well as the importance of customizing this to his specific health and social needs.   We discussed the benefits of reaching a healthier weight to  alleviate the symptoms of existing conditions and reduce the risks of the biomechanical, metabolic and psychological effects of obesity.  Was counseled on the health benefits of losing 5-10% of total body weight.  Was counseled on our cognitive behavorial therapy program, lead by our bariatric psychologist, who focuses on emotional eating and creating positive behavorial change.  Was counseled on bariatric pharmacotherapy and how this may be used as an adjunct in their weight management   Brian Lam appears to be in the action stage of change and states they are ready to start intensive lifestyle modifications and behavioral modifications.  It was recommended that he follow up in the next 1-2 weeks to review the above steps, and to continue with treatment of their chronic disease state of obesity   FOR OTHER CONDITIONS RELATED TO THE DISEASE OF OBESITY:  Gastroesophageal reflux disease, unspecified whether esophagitis present Assessment & Plan: H/o GERD. Condition acutely aggravated - pt feels this may have been secondary to Maloxicam which he was taking for tennis elbow. On 05/16/2023, pt was instructed by PCP to stop the Maloxicam, increase Omeprazole to 20 mg twice daily, and maintain  with Pepcid at current dose. PCP continues to monitor condition and if symptoms do not improve they plan to set up a GI referall. Losing 10% or more of body weight may improve overall condition.    Obstructive sleep apnea Assessment & Plan: Condition managed by cardiology. New onset. Had a home sleep study on 03/13/2023 which showed severe Obstructive Sleep Apnea with AHI 57.7/hr. Pt will be scheduling a CPAP titration study for the near future.  Losing 10-15% or more of body weight may improve AHI.     Prediabetes Assessment & Plan: Pt was informed he was prediabetic by PCP on 06/12/21 when labs showed a Hemoglobin A1c of 5.9. His PCP started him on Ozempic. He was on Ozempic for 1 year and went up to no higher  than the 1 mg dose. The medication was ineffective and pt did not experience weight loss.   Prediabetes may contribute to abnormal cravings, fatigue and diabetic complications without having diabetes. We will check fasting blood glucose and insulin levels with intake labs at next OV. He would benefit from a high protein low-carb meal plan.    Attestations:   Reviewed by clinician on day of visit: allergies, medications, problem list, medical history, surgical history, family history, social history, and previous encounter notes pertinent to obesity diagnosis. 60 minutes was spent today on this visit including the above counseling, pre-visit chart review, and post-visit documentation.  Over 50% of this time was spent in direct, face-to-face counseling and coordination of care  I, Special Randolm Idol, acting as a medical scribe for Thomasene Lot, DO., have compiled all relevant documentation for today's office visit on behalf of Thomasene Lot, DO, while in the presence of [employer], DO.  I have reviewed the above documentation for accuracy and completeness, and I agree with the above. [name], D.O.  The 21st Century Cures Act was signed into law in 2016 which includes the topic of electronic health records.  This provides immediate access to information in MyChart.  This includes consultation notes, operative notes, office notes, lab results and pathology reports.  If you have any questions about what you read please let us know at your next visit so we can discuss your concerns and take corrective action if need be.  We are right here with you!

## 2023-05-20 NOTE — Telephone Encounter (Signed)
**Note De-Identified [facility]acobucci Obfuscation** Letter received from [facility] Brian Lam fax stating that they have denied coverage for this CPAP Titration because: Treatment with APAP must have been tried for at least 30 days. The notes we received from your doctor does not show this trail.  Forwarding this note to Dr Mayford Knife and her sleep coordinator for advisement to the pt.

## 2023-05-20 NOTE — Telephone Encounter (Addendum)
 Per DrTurner.Marland KitchenMarland KitchenIf unable to perform an in lab titration then initiate ResMed auto CPAP from 4 to 15cm H2O with heated humidity and mask of choice and overnight pulse ox on CPAP.     Upon patient request DME selection is [facility] Patient understands he will be contacted by [facility] to set up his cpap. Patient understands to call if [facility] does not contact him with new setup in a timely manner. Patient understands they will be called once confirmation has been received from [facility] that they have received their new machine to schedule 10 week follow up appointment.   [facility] notified of new cpap order  Please add to airview Patient was grateful for the call and thanked me.

## 2023-05-23 NOTE — Telephone Encounter (Signed)
**Note De-Identified Kristyna Bradstreet Obfuscation** Please see phone note from 05/16/23 for update.

## 2023-05-24 ENCOUNTER — Telehealth (INDEPENDENT_AMBULATORY_CARE_PROVIDER_SITE_OTHER): Admitting: Family Medicine

## 2023-05-24 ENCOUNTER — Encounter: Payer: Self-pay | Admitting: Family Medicine

## 2023-05-24 DIAGNOSIS — G43109 Migraine with aura, not intractable, without status migrainosus: Secondary | ICD-10-CM

## 2023-05-24 MED ORDER — TOPIRAMATE 25 MG PO TABS: ORAL_TABLET | ORAL | 2 refills | Status: DC

## 2023-05-24 MED ORDER — NURTEC 75 MG PO TBDP: ORAL_TABLET | ORAL | 5 refills | Status: AC

## 2023-05-24 MED ORDER — PROPRANOLOL HCL ER 60 MG PO CP24: ORAL_CAPSULE | ORAL | 0 refills | Status: DC

## 2023-05-24 NOTE — Progress Notes (Signed)
 Patient ID: Brian Lam, male   DOB: 1985-09-06, 38 y.o.   MRN: [id number]  Virtual Visit via Video Note  I connected with Brian Lam on 05/24/23 at  9:30 AM EDT by a video enabled telemedicine application and verified that I am speaking with the correct person using two identifiers.  Location patient: home Location provider:work or home office Persons participating in the virtual visit: patient, provider  I discussed the limitations of evaluation and management by telemedicine and the availability of in person appointments. The patient expressed understanding and agreed to proceed.   HPI: Brian Lam called to discuss migraine treatments.  He is currently on Inderal LA 120 mg daily.  He denies any major adverse symptoms but is struggling with weight gain and difficulty losing weight.  He went to weight and wellness management recently and they suggested discussing possible alternative (to Inderal).  Also, he has not had great prevention of migraines with Inderal.  For example, has had 3 migraines in the past few weeks even taking this regularly.  He has taken Maxalt in the past for acute relief but has not gotten great results.  He would like to look at other alternatives there.  His wife has history of migraines and she had given him one of her Nurtec which seem to work much more effectively.  He feels like stress and neck tension are likely triggers.   ROS: See pertinent positives and negatives per HPI.  Past Medical History:  Diagnosis Date   Chronic knee pain    Contact dermatitis    Depression    GERD (gastroesophageal reflux disease)    History of kidney stones    Migraines    Pneumonia    Pre-diabetes     Past Surgical History:  Procedure Laterality Date   CYSTOSCOPY/URETEROSCOPY/HOLMIUM LASER/STENT PLACEMENT Right 01/10/2023   Procedure: CYSTOSCOPY, RIGHT RETROGRADE PYELOGRAM, RIGHT URETEROSCOPY, HOLMIUM LASER LITHOTRIPSY, AND RIGHT URETERAL STENT PLACEMENT;  Surgeon: Jannifer Hick, MD;  Location: [facility];  Service: Urology;  Laterality: Right;  60 MINUTES   VASECTOMY  2020    Family History  Problem Relation Age of Onset   Diabetes Father    Diabetes type II Father    Heart attack Maternal Grandfather    Diabetes Maternal Grandfather    Colon cancer Neg Hx    Esophageal cancer Neg Hx    Stomach cancer Neg Hx    Rectal cancer Neg Hx     SOCIAL HX: Non-smoker.   Current Outpatient Medications:    albuterol (VENTOLIN HFA) 108 (90 Base) MCG/ACT inhaler, Inhale 1-2 puffs into the lungs as needed for wheezing or shortness of breath., Disp: , Rfl:    famotidine (PEPCID) 20 MG tablet, Take 20 mg by mouth at bedtime., Disp: , Rfl:    FLUoxetine (PROZAC) 40 MG capsule, Take 1 capsule (40 mg total) by mouth daily., Disp: 30 capsule, Rfl: 0   omeprazole (PRILOSEC) 20 MG capsule, Take 1 tablet by mouth twice daily (Patient taking differently: Take 20 mg by mouth in the morning and at bedtime.), Disp: 180 capsule, Rfl: 0   oxyCODONE (ROXICODONE) 5 MG immediate release tablet, Take 1 tablet (5 mg total) by mouth every 6 (six) hours as needed for severe pain., Disp: 15 tablet, Rfl: 0   oxyCODONE-acetaminophen (PERCOCET) 5-325 MG tablet, Take 1 tablet by mouth every 4 (four) hours as needed for up to 18 doses for severe pain (pain score 7-10)., Disp: 18 tablet, Rfl: 0   propranolol ER (  INDERAL LA) 120 MG 24 hr capsule, Take 1 capsule (120 mg total) by mouth daily., Disp: 90 capsule, Rfl: 0   tamsulosin (FLOMAX) 0.4 MG CAPS capsule, Take 1 capsule (0.4 mg total) by mouth daily after supper., Disp: 10 capsule, Rfl: 0  EXAM:  VITALS per patient if applicable:  GENERAL: alert, oriented, appears well and in no acute distress  HEENT: atraumatic, conjunttiva clear, no obvious abnormalities on inspection of external nose and ears  NECK: normal movements of the head and neck  LUNGS: on inspection no signs of respiratory distress, breathing rate appears normal, no obvious gross  SOB, gasping or wheezing  CV: no obvious cyanosis  MS: moves all visible extremities without noticeable abnormality  PSYCH/NEURO: pleasant and cooperative, no obvious depression or anxiety, speech and thought processing grossly intact  ASSESSMENT AND PLAN:  Discussed the following assessment and plan:  Migraine headache without aura.  Patient currently on Inderal but has had problems with weight gain and difficulty losing weight.  Also not getting great prevention with Inderal.  We discussed several options as follows  -We discussed other options such as TCA but explained that these could also cause weight gain along with many other side effects. -We discussed option of Topamax which could offer benefit of not only prevention but potentially assist with weight loss..  After much discussion of this versus other options such as verapamil and Effexor we decided to try Topamax 25 mg nightly for 1 week and then titrate to 50 mg nightly.  Follow-up in 1 month.  If not getting adequate relief at that point go to twice daily dosing with target dose of 50 mg twice daily -Taper off and around about going to 60 mg once daily for 2 weeks and then discontinue  -We also discussed change in acute treatment since he is not getting good result with Maxalt.  He thinks he had problems with other triptans such as Relpax in the past.  Has responded to Nurtec.  Sent in prescription for Nurtec 75 mg 1 at onset of migraine may repeat 1 to 2 hours as needed but no more than 2 in 24 hours.  Give feedback in 1 month for follow-up  We spent 30 minutes between face-to-face virtual time and non-face-to-face time dedicated this visit     I discussed the assessment and treatment plan with the patient. The patient was provided an opportunity to ask questions and all were answered. The patient agreed with the plan and demonstrated an understanding of the instructions.   The patient was advised to call back or seek an  in-person evaluation if the symptoms worsen or if the condition fails to improve as anticipated.     Evelena Peat, MD

## 2023-05-24 NOTE — Progress Notes (Signed)
 Patient was unable to self-report due to a lack of equipment at home via telehealth

## 2023-05-27 ENCOUNTER — Other Ambulatory Visit (HOSPITAL_COMMUNITY): Payer: Self-pay

## 2023-05-27 ENCOUNTER — Telehealth: Payer: Self-pay | Admitting: Pharmacy Technician

## 2023-05-27 NOTE — Telephone Encounter (Signed)
 Pharmacy Patient Advocate Encounter  Received notification from [facility] that Prior Authorization for NURTEC  75MG  DISPERSIBLE TABLETS has been APPROVED from 04/27/23 to 05/26/24. Unable to obtain price due to refill too soon rejection, last fill date 05/24/23 next available fill date03/18/25   PA #/Case ID/Reference #: [id number]

## 2023-06-10 ENCOUNTER — Encounter: Payer: Self-pay | Admitting: Family Medicine

## 2023-06-13 MED ORDER — FLUOXETINE HCL 40 MG PO CAPS: 40.0000 mg | ORAL_CAPSULE | Freq: Every day | ORAL | 0 refills | Status: DC

## 2023-06-20 ENCOUNTER — Ambulatory Visit (INDEPENDENT_AMBULATORY_CARE_PROVIDER_SITE_OTHER): Admitting: Family Medicine

## 2023-06-20 ENCOUNTER — Encounter (INDEPENDENT_AMBULATORY_CARE_PROVIDER_SITE_OTHER): Payer: Self-pay | Admitting: Family Medicine

## 2023-06-20 VITALS — BP 119/76 | HR 85 | Temp 98.1°F | Ht 72.0 in | Wt 331.0 lb

## 2023-06-20 DIAGNOSIS — K219 Gastro-esophageal reflux disease without esophagitis: Secondary | ICD-10-CM

## 2023-06-20 DIAGNOSIS — G4733 Obstructive sleep apnea (adult) (pediatric): Secondary | ICD-10-CM | POA: Diagnosis not present

## 2023-06-20 DIAGNOSIS — Z1331 Encounter for screening for depression: Secondary | ICD-10-CM

## 2023-06-20 DIAGNOSIS — F5089 Other specified eating disorder: Secondary | ICD-10-CM

## 2023-06-20 DIAGNOSIS — E65 Localized adiposity: Secondary | ICD-10-CM

## 2023-06-20 DIAGNOSIS — R5383 Other fatigue: Secondary | ICD-10-CM

## 2023-06-20 DIAGNOSIS — R0602 Shortness of breath: Secondary | ICD-10-CM | POA: Diagnosis not present

## 2023-06-20 DIAGNOSIS — G43109 Migraine with aura, not intractable, without status migrainosus: Secondary | ICD-10-CM

## 2023-06-20 DIAGNOSIS — R7303 Prediabetes: Secondary | ICD-10-CM

## 2023-06-20 DIAGNOSIS — F3289 Other specified depressive episodes: Secondary | ICD-10-CM

## 2023-06-20 DIAGNOSIS — Z6841 Body Mass Index (BMI) 40.0 and over, adult: Secondary | ICD-10-CM

## 2023-06-20 NOTE — Progress Notes (Signed)
 [name], D.O.  ABFM, ABOM Specializing in Clinical Bariatric Medicine Office located at: [address]  [place], [place]  [address]   Bariatric Medicine Visit  Dear Brian Covey, MD   Thank you for referring Brian Lam to our clinic today for evaluation.  We performed a consultation to discuss his options for treatment and educate the patient on his disease state.  The following note includes my evaluation and treatment recommendations.   Please do not hesitate to reach out to me directly if you have any further concerns.   Assessment and Plan:   Orders Placed This Encounter  Procedures   Folate   CBC with Differential/Platelet   Comprehensive metabolic panel with GFR   Hemoglobin A1c   Lipid panel   T4, free   TSH   Vitamin B12   VITAMIN D 25 Hydroxy (Vit-D Deficiency, Fractures)   Insulin, random   EKG 12-Lead    There are no discontinued medications.   No orders of the defined types were placed in this encounter.     FOR THE DISEASE OF OBESITY:  Morbid obesity -- starting bmi 44.88 Assessment & Plan:  Recommended Dietary Goals Brian Lam is currently in the action stage of change. As such, his goal is to start our weight management plan.  He has agreed to:  Start following CAT 4 MP with L options     Behavioral Intervention We discussed the following Behavioral Modification Strategies today: increasing lean protein intake to established goals, decreasing simple carbohydrates , and increasing water intake   Additional resources provided today: Handout on CAT 4 meal plan and Handout on CAT 3-4 lunch options  Evidence-based interventions for health behavior change were utilized today including the discussion of self monitoring techniques, problem-solving barriers and SMART goal setting techniques.    Pt will specifically work on: n/a   Recommended Physical Activity Goals Brian Lam has been advised to work up to 150 minutes of moderate  intensity aerobic activity a week and strengthening exercises 2-3 times per week for cardiovascular health, weight loss maintenance and preservation of muscle mass.   He has agreed to : maintain current level of activity.    Pharmacotherapy We both agreed to : continue with nutritional and behavioral strategies   FOR ASSOCIATED CONDITIONS ADDRESSED TODAY:  Fatigue Assessment & Plan: [name] does feel that his weight is causing his energy to be lower than it should be. Fatigue may be related to obesity, depression or many other causes. he does not appear to have any red flag symptoms and this appears to most likely be related to his current lifestyle habits and dietary intake.  Labs will be ordered and reviewed with him at their next office visit in two weeks.  Epworth sleepiness scale is 14 and appears to be OR not be (delete one) within normal limits. Brian Lam admits to daytime somnolence and reports waking up still tired. Patient has a history of symptoms of daytime fatigue, morning fatigue, Epworth sleepiness scale, and morning headache. [name] generally gets 5 or 6 hours of sleep per night, and states that he has generally unrestful sleep. Snoring is present. Apneic episodes are present.   ECG: Performed and reviewed/ interpreted independently.  Normal sinus rhythm, rate 80 bpm; reassuring without any acute abnormalities, will continue to monitor for symptoms    Shortness of breath on exertion Assessment & Plan: Brian Lam does feel that he gets out of breath more easily than he used to when he exercises and  seems to be worsening over time with weight gain.  This has gotten worse recently. Brian Lam denies shortness of breath at rest or orthopnea. Pt denies chest pain, dizziness, heart palpitations, or excessive diaphoresis or nausea with activity.  This is not new and is ongoing. Brian Lam's shortness of breath appears to be obesity related and exercise induced, as they do not appear to have any "red  flag" symptoms/ concerns today.  Also, this condition appears to be related to a state of poor cardiovascular conditioning   Obtain labs today and will be reviewed with him at their next office visit in two weeks.  Relevant Orders: -     EKG 12-Lead -     Folate -     CBC with Differential/Platelet   Indirect Calorimeter completed today to help guide our dietary regimen. It shows a VO2 of 362 and a REE of 2506. His calculated basal metabolic rate is 4098 thus his resting energy expenditure is worse than expected.  Patient agreed to work on weight loss at this time.  As Brian Lam progresses through our weight loss program, we will gradually increase exercise as tolerated to treat his current condition. If Brian Lam follows our recommendations and loses 5-10% of their weight without improvement of his shortness of breath or if at any time, symptoms become more concerning, they agree to urgently follow up with their PCP/ specialist for further consideration/ evaluation. Brian Lam verbalizes agreement with this plan.    Depression Screen  Other depression - emotional eating Assessment & Plan: His Food and Mood (modified PHQ-9) score was positive at 16. Pt is taking Prozac 40 mg daily. Brian Lam's mood is currently stable, denies SI/HI. Pt admits to eating when stressed, sad, to help stay awake, as a reward, when bored, when feeling guilty, when angry, and to comfort himself. He reports his depressive episodes are intermittent, but on the medication he notices these have significantly improved. He endorses his mood is well controlled. Pt has personal therapist that he sees biweekly. I referred pt to Dr. [name], our bariatric psychologist. Pt declines referral at this time, prefers to continue seeing personal therapist. Discussed how thoughts affect eating habits, modeling of thoughts, feelings, and behaviors, and strategies for change. Will monitor condition closely alongside specialist.   Relevant Orders: -      T4, free -     TSH   Prediabetes Assessment & Plan: Brian Lam is not currently taking any medications for this condition. Pts A1c over 2 years ago was 5.9. His PCP started him on Ozempic in the past for PreDM and was on this medication for about a year. Pt lost about 30 lbs on Ozempic, but regained. Start following Category 4 meal plan with high lean protein, and low simple carbs. Incorporate daily exercise into routine. Will check A1c and insulin levels today.   Relevant Orders: -     Hemoglobin A1c -     Lipid panel -     Insulin, random   Gastroesophageal reflux disease, unspecified whether esophagitis present Assessment & Plan: Eban is taking Prilosec 20 mg twice daily and Pepcid 20 mg once daily. Pt has hx of stomach ulcers. Colonoscopy last performed 10/21/2021 -- results showed no abnormalities. He endorses sometimes having to take alka-seltzer in addition to medications before bed. Encouraged pt not to eat three hours before laying down for bed to avoid excessive reflux. If sx do not resolve, f/up with GI specialist to consult about poor control of condition.  Pt will work on  diet, exercise and weight loss efforts.  Will continue to monitor.   Relevant Orders: -     Comprehensive metabolic panel with GFR -     Vitamin B12 -     VITAMIN D 25 Hydroxy (Vit-D Deficiency, Fractures)   Obstructive sleep apnea on CPAP Assessment & Plan: Tai reports starting on CPAP machine two nights ago. Pt endorses improvement in sleep condition. Instructed pt to continue wearing CPAP nightly to ensure restful sleep. Encouraged to aim for 7-9 hours of sleep nightly. Informed that condition may improve with weight loss. Will monitor condition closely.    Migraine with aura and without status migrainosus, not intractable Assessment & Plan: Pt is on Topiramate 25 mg twice daily and Nurtec ODT as needed. Condition is well controlled. However, pt notes that recently migraines have been triggered by  poor sleeping positions. He resolves this with Nurtec. Encouraged pt to f/up with PCP if migraines become significantly recurrent. Continue current medication regimen. Will monitor alongside PCP.     Visceral obesity Assessment & Plan: Current visceral fat rating: 23. The visceral fat rating should be <10 in a male. Additionally, pts body fat percentage has improved since info session from 38.9% to 37.9%. Visceral adipose tissue is a hormonally active component of total body fat. This body composition phenotype is associated with medical disorders such as metabolic syndrome, cardiovascular disease and several malignancies including prostate and colorectal cancers. Educated pt that an increased visceral fat rating can also increase risk of heart attack and stroke. Starting goal: Lose 7-10% of weight via prudent nutritional plan and lifestyle changes. Limit foods high in saturated/trans fats and simple carbs. Will continue to monitor levels as it pertains to his weight loss journey.    FOLLOW UP:   Follow up in 2 weeks. He was informed of the importance of frequent follow up visits to maximize his success with intensive lifestyle modifications for his multiple health conditions.  Orvie Caradine is aware that we will review all of his lab results at our next visit.  He is aware that if anything is critical/ life threatening with the results, we will be contacting him via MyChart prior to the office visit to discuss management.    Chief Complaint:   OBESITY Damarian Priola (MR# [id number]) is a pleasant 38 y.o. male who presents for evaluation and treatment of obesity and related comorbidities. Current BMI is Body mass index is 44.89 kg/m. Doris Mcgilvery has been struggling with his weight for many years and has been unsuccessful in either losing weight, maintaining weight loss, or reaching his healthy weight goal.  Able Malloy is currently in the action stage of change and ready to dedicate time  achieving and maintaining a healthier weight. Yiannis Tulloch is interested in becoming our patient and working on intensive lifestyle modifications including (but not limited to) diet and exercise for weight loss.  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 Patient is married to [employer]  and has 3 children. He lives with his wife and children; Leonette Most (4), Lucy (9), and Alice (10).  Does not exercise  Never smoked  Aspires to be 250 lbs in one year  Gained weight due to poor diet, sedentary lifestyle, and Fhx of obesity  Heaviest: 333 lbs today  Tried Atkins in the past  Was on ozempic (lost 30 lbs but regained)  Eats outside of the home 1 time a week  Craves sweets mostly at night and after dinner  Snacks on sweets and  pastries  Sometimes children want pizza and ice cream making it difficult to eat healthy  Drinks several caloric beverages including 2-3 beer/liquor per week  Regular soda 2-3 per week  Worst food habit: ice cream or donuts at least once per week  Frequently eats at work while doing other things  Subjective:   This is the patient's first visit at [facility].  The patient's NEW PATIENT PACKET that they filled out prior to today's office visit was reviewed at length and information from that paperwork was included within the following office visit note.    Included in the packet: current and past health history, medications, allergies, ROS, gynecologic history (women only), surgical history, family history, social history, weight history, weight loss surgery history (for those that have had weight loss surgery), nutritional evaluation, mood and food questionnaire along with a depression screening (PHQ9) on all patients, an Epworth questionnaire, sleep habits questionnaire, patient life and health improvement goals questionnaire. These will all be scanned into the patient's chart under the "media" tab.   Review of Systems: Please refer  to new patient packet scanned into media. Pertinent positives were addressed with patient today.  Reviewed by clinician on day of visit: allergies, medications, problem list, medical history, surgical history, family history, social history, and previous encounter notes.  During the visit, I independently reviewed the patient's EKG, bioimpedance scale results, and indirect calorimeter results. I used this information to tailor a meal plan for the patient that will help Yunior Jain to lose weight and will improve his obesity-related conditions going forward.  I performed a medically necessary appropriate examination and/or evaluation. I discussed the assessment and treatment plan with the patient. The patient was provided an opportunity to ask questions and all were answered. The patient agreed with the plan and demonstrated an understanding of the instructions. Labs were ordered today (unless patient declined them) and will be reviewed with the patient at our next visit unless more critical results need to be addressed immediately. Clinical information was updated and documented in the EMR.    Objective:   PHYSICAL EXAM: Blood pressure 119/76, pulse 85, temperature 98.1 F (36.7 C), height 6' (1.829 m), weight (!) 331 lb (150.1 kg), SpO2 92%. Body mass index is 44.89 kg/m.  General: Well Developed, well nourished, and in no acute distress.  HEENT: Normocephalic, atraumatic; EOMI, sclerae are anicteric. Skin: Warm and dry, good turgor Chest:  Normal excursion, shape, no gross ABN Respiratory: No conversational dyspnea; speaking in full sentences NeuroM-Sk:  Normal gross ROM * 4 extremities  Psych: A and O *3, insight adequate, mood- full   Anthropometric Measurements Height: 6' (1.829 m) Weight: (!) 331 lb (150.1 kg) BMI (Calculated): 44.88 Weight at Last Visit: N/A Weight Lost Since Last Visit: N/A Weight Gained Since Last Visit: N/A Starting Weight: 331 lb Peak Weight: 335 lb Waist  Measurement : 54 inches   Body Composition  Body Fat %: 37.9 % Fat Mass (lbs): 125.8 lbs Muscle Mass (lbs): 196 lbs Total Body Water (lbs): 149.6 lbs Visceral Fat Rating : 23   Other Clinical Data Fasting: Yes Labs: Yes Today's Visit #: #1 Starting Date: 06/20/23 Comments: First Visit    DIAGNOSTIC DATA REVIEWED:  BMET    Component Value Date/Time   NA 139 01/10/2023 1230   K 3.9 01/10/2023 1230   CL 109 01/10/2023 1230   CO2 22 01/10/2023 1230   GLUCOSE 91 01/10/2023 1230   BUN 17 01/10/2023 1230   CREATININE  1.03 01/10/2023 1230   CALCIUM 8.8 (L) 01/10/2023 1230   GFRNONAA >60 01/10/2023 1230   Lab Results  Component Value Date   HGBA1C 5.9 06/12/2021   No results found for: "INSULIN" Lab Results  Component Value Date   TSH 1.86 09/22/2021   CBC    Component Value Date/Time   WBC 8.4 11/26/2022 0648   RBC 4.77 11/26/2022 0648   HGB 13.6 11/26/2022 0648   HCT 39.6 11/26/2022 0648   PLT 193 11/26/2022 0648   MCV 83.0 11/26/2022 0648   MCH 28.5 11/26/2022 0648   MCHC 34.3 11/26/2022 0648   RDW 13.3 11/26/2022 0648   Iron Studies No results found for: "IRON", "TIBC", "FERRITIN", "IRONPCTSAT" Lipid Panel     Component Value Date/Time   CHOL 164 06/12/2021 0923   TRIG 170.0 (H) 06/12/2021 0923   HDL 35.30 (L) 06/12/2021 0923   CHOLHDL 5 06/12/2021 0923   VLDL 34.0 06/12/2021 0923   LDLCALC 95 06/12/2021 0923   Hepatic Function Panel     Component Value Date/Time   PROT 7.4 11/26/2022 0648   ALBUMIN 4.3 11/26/2022 0648   AST 25 11/26/2022 0648   ALT 31 11/26/2022 0648   ALKPHOS 37 (L) 11/26/2022 0648   BILITOT 0.5 11/26/2022 0648   BILIDIR 0.1 11/26/2022 0648   IBILI 0.4 11/26/2022 0648      Component Value Date/Time   TSH 1.86 09/22/2021 1009   Lab Results  Component Value Date   VD25OH 27.4 (L) 06/20/2023       Attestation Statements:   I, Camryn Mix, acting as a Stage manager for [employer], DO., have compiled all  relevant documentation for today's office visit on behalf of Thomasene Lot, DO, while in the presence of [employer], DO.  I have reviewed the above documentation for accuracy and completeness, and I agree with the above. [name], D.O.  The 21st Century Cures Act was signed into law in 2016 which includes the topic of electronic health records.  This provides immediate access to information in MyChart.  This includes consultation notes, operative notes, office notes, lab results and pathology reports.  If you have any questions about what you read please let us know at your next visit so we can discuss your concerns and take corrective action if need be.  We are right here with you.

## 2023-06-21 LAB — CBC WITH DIFFERENTIAL/PLATELET
Basophils Absolute: 0 10*3/uL (ref 0.0–0.2)
Basos: 1 %
EOS (ABSOLUTE): 0.4 10*3/uL (ref 0.0–0.4)
Eos: 6 %
Hematocrit: 41.4 % (ref 37.5–51.0)
Hemoglobin: 13.7 g/dL (ref 13.0–17.7)
Immature Grans (Abs): 0 10*3/uL (ref 0.0–0.1)
Immature Granulocytes: 0 %
Lymphocytes Absolute: 2.2 10*3/uL (ref 0.7–3.1)
Lymphs: 37 %
MCH: 28.5 pg (ref 26.6–33.0)
MCHC: 33.1 g/dL (ref 31.5–35.7)
MCV: 86 fL (ref 79–97)
Monocytes Absolute: 0.4 10*3/uL (ref 0.1–0.9)
Monocytes: 6 %
Neutrophils Absolute: 3.1 10*3/uL (ref 1.4–7.0)
Neutrophils: 50 %
Platelets: 202 10*3/uL (ref 150–450)
RBC: 4.8 x10E6/uL (ref 4.14–5.80)
RDW: 13.4 % (ref 11.6–15.4)
WBC: 6.1 10*3/uL (ref 3.4–10.8)

## 2023-06-21 LAB — FOLATE: Folate: 4.9 ng/mL (ref >3.0)

## 2023-06-21 LAB — VITAMIN D 25 HYDROXY (VIT D DEFICIENCY, FRACTURES): Vit D, 25-Hydroxy: 27.4 ng/mL — ABNORMAL LOW (ref 30.0–100.0)

## 2023-06-21 LAB — T4, FREE: Free T4: 0.94 ng/dL (ref 0.82–1.77)

## 2023-06-21 LAB — COMPREHENSIVE METABOLIC PANEL WITH GFR
ALT: 31 IU/L (ref 0–44)
AST: 23 IU/L (ref 0–40)
Albumin: 4.4 g/dL (ref 4.1–5.1)
Alkaline Phosphatase: 48 IU/L (ref 44–121)
BUN/Creatinine Ratio: 12 (ref 9–20)
BUN: 15 mg/dL (ref 6–20)
Bilirubin Total: 0.4 mg/dL (ref 0.0–1.2)
CO2: 23 mmol/L (ref 20–29)
Calcium: 8.9 mg/dL (ref 8.7–10.2)
Chloride: 104 mmol/L (ref 96–106)
Creatinine, Ser: 1.29 mg/dL — ABNORMAL HIGH (ref 0.76–1.27)
Globulin, Total: 1.9 g/dL (ref 1.5–4.5)
Glucose: 93 mg/dL (ref 70–99)
Potassium: 4.4 mmol/L (ref 3.5–5.2)
Sodium: 140 mmol/L (ref 134–144)
Total Protein: 6.3 g/dL (ref 6.0–8.5)
eGFR: 72 mL/min/{1.73_m2} (ref >59)

## 2023-06-21 LAB — LIPID PANEL
Chol/HDL Ratio: 4.9 ratio (ref 0.0–5.0)
Cholesterol, Total: 181 mg/dL (ref 100–199)
HDL: 37 mg/dL — ABNORMAL LOW (ref >39)
LDL Chol Calc (NIH): 106 mg/dL — ABNORMAL HIGH (ref 0–99)
Triglycerides: 220 mg/dL — ABNORMAL HIGH (ref 0–149)
VLDL Cholesterol Cal: 38 mg/dL (ref 5–40)

## 2023-06-21 LAB — VITAMIN B12: Vitamin B-12: 186 pg/mL — ABNORMAL LOW (ref 232–1245)

## 2023-06-21 LAB — HEMOGLOBIN A1C
Est. average glucose Bld gHb Est-mCnc: 120 mg/dL
Hgb A1c MFr Bld: 5.8 % — ABNORMAL HIGH (ref 4.8–5.6)

## 2023-06-21 LAB — INSULIN, RANDOM: INSULIN: 35.5 u[IU]/mL — ABNORMAL HIGH (ref 2.6–24.9)

## 2023-06-21 LAB — TSH: TSH: 1.87 u[IU]/mL (ref 0.450–4.500)

## 2023-06-27 ENCOUNTER — Ambulatory Visit (INDEPENDENT_AMBULATORY_CARE_PROVIDER_SITE_OTHER): Admitting: Family Medicine

## 2023-07-04 ENCOUNTER — Ambulatory Visit (INDEPENDENT_AMBULATORY_CARE_PROVIDER_SITE_OTHER): Admitting: Family Medicine

## 2023-07-04 ENCOUNTER — Encounter (INDEPENDENT_AMBULATORY_CARE_PROVIDER_SITE_OTHER): Payer: Self-pay | Admitting: Family Medicine

## 2023-07-04 VITALS — BP 107/68 | HR 82 | Temp 97.6°F | Ht 72.0 in | Wt 323.0 lb

## 2023-07-04 DIAGNOSIS — E559 Vitamin D deficiency, unspecified: Secondary | ICD-10-CM

## 2023-07-04 DIAGNOSIS — E782 Mixed hyperlipidemia: Secondary | ICD-10-CM

## 2023-07-04 DIAGNOSIS — G4733 Obstructive sleep apnea (adult) (pediatric): Secondary | ICD-10-CM | POA: Diagnosis not present

## 2023-07-04 DIAGNOSIS — E538 Deficiency of other specified B group vitamins: Secondary | ICD-10-CM

## 2023-07-04 DIAGNOSIS — R7303 Prediabetes: Secondary | ICD-10-CM | POA: Diagnosis not present

## 2023-07-04 DIAGNOSIS — Z6841 Body Mass Index (BMI) 40.0 and over, adult: Secondary | ICD-10-CM

## 2023-07-04 DIAGNOSIS — R7989 Other specified abnormal findings of blood chemistry: Secondary | ICD-10-CM

## 2023-07-04 MED ORDER — VITAMIN D (ERGOCALCIFEROL) 1.25 MG (50000 UNIT) PO CAPS: 50000.0000 [IU] | ORAL_CAPSULE | ORAL | 1 refills | Status: DC

## 2023-07-04 MED ORDER — B COMPLEX VITAMINS PO CAPS: 1.0000 | ORAL_CAPSULE | Freq: Every day | ORAL | Status: AC

## 2023-07-04 NOTE — Progress Notes (Signed)
 Rae Bugler, D.O.  ABFM, ABOM Clinical Bariatric Medicine Physician  Office located at: [address] [place], [place]  [address]     Assessment and Plan:  No orders of the defined types were placed in this encounter.  Medications Discontinued During This Encounter  Medication Reason   oxyCODONE -acetaminophen  (PERCOCET) 5-325 MG tablet     Meds ordered this encounter  Medications   b complex vitamins capsule    Sig: Take 1 capsule by mouth daily. 500 mcg-1000 mcg B12 daily   Vitamin D , Ergocalciferol , (DRISDOL ) 1.25 MG (50000 UNIT) CAPS capsule    Sig: Take 1 capsule (50,000 Units total) by mouth every 7 (seven) days.    Dispense:  4 capsule    Refill:  1     FOR THE DISEASE OF OBESITY:  Morbid obesity -- starting bmi 44.88 BMI 40.0-44.9, adult (HCC) - Current 43.8 Assessment & Plan: Since last office visit on 06/20/2023, patient's muscle mass has decreased by 1.8 lbs. Fat mass has decreased by 7 lbs. Total body water  has decreased by 0.6 lbs.  Counseling done on how various foods will affect these numbers and how to maximize success  Total lbs lost to date: 8 lbs Total weight loss percentage to date: 2.42%    Recommended Dietary Goals Brian Lam is currently in the action stage of change. As such, his goal is to continue weight management plan.  He has agreed to: continue current plan   Behavioral Intervention We discussed the following today: Moving around ounces throughout the day, focusing on food with a 10:1 ratio of calories: grams of protein  Additional resources provided today: Handout with 100 calorie snack ideas, Handout on CAT 4 meal plan, Handout on CAT 3-4 lunch options, Handout on Pre-Diabetes education , and Handout on insulin  resistance education  Evidence-based interventions for health behavior change were utilized today including the discussion of self monitoring techniques, problem-solving barriers and SMART goal setting techniques.   Regarding patient's less desirable eating habits and patterns, we employed the technique of small changes.   Pt will specifically work on: Moving food around to best fit hunger, cravings, and schedule for next OV   Recommended Physical Activity Goals Brian Lam has been advised to work up to 150 minutes of moderate intensity aerobic activity a week and strengthening exercises 2-3 times per week for cardiovascular health, weight loss maintenance and preservation of muscle mass.   He has agreed to :  Continue current level of physical activity    Pharmacotherapy We both agreed to : continue with nutritional and behavioral strategies   FOR ASSOCIATED CONDITIONS ADDRESSED TODAY:   Pre-diabetes Assessment & Plan: Lab Results  Component Value Date   HGBA1C 5.8 (H) 06/20/2023   HGBA1C 5.9 06/12/2021   INSULIN  35.5 (H) 06/20/2023   Lab Results  Component Value Date   TSH 1.870 06/20/2023   FREET4 0.94 06/20/2023      Component Value Date/Time   PROT 6.3 06/20/2023 0901   ALBUMIN 4.4 06/20/2023 0901   AST 23 06/20/2023 0901   ALT 31 06/20/2023 0901   ALKPHOS 48 06/20/2023 0901   BILITOT 0.4 06/20/2023 0901   BILIDIR 0.1 11/26/2022 0648   IBILI 0.4 11/26/2022 0648   Lab Results  Component Value Date   WBC 6.1 06/20/2023   HGB 13.7 06/20/2023   HCT 41.4 06/20/2023   MCV 86 06/20/2023   PLT 202 06/20/2023  Brian Lam is not currently taking any medications for this condition. He was on  started on Ozempic  in the past per his PCP. He was on this medication for about a year, lost 30 lbs but regained. Per last obtained labs, A1c has slightly decreased but is maintained at 5.8. Insulin  levels are 7x the goal at 35.5. CBC, liver, and thyroid  levels all WNL.   Decreasing carbs and increasing lean protein can assist with maintaining A1c levels. Informed pt that the more adipose tissue he has, the more resistant he is to insulin  which will cause his body to produce more. Educated pt on how  insulin  stimulates hunger and cravings. Decreasing simple carbs can control insulin  levels and blood sugars. Closely adhere to high protein, low carb MP and engage in consistent physical activity. Will continue to monitor alongside PCP.    Mixed hyperlipidemia - new onset Assessment & Plan: Lab Results  Component Value Date   CHOL 181 06/20/2023   HDL 37 (L) 06/20/2023   LDLCALC 106 (H) 06/20/2023   TRIG 220 (H) 06/20/2023   CHOLHDL 4.9 06/20/2023  This is a new diagnosis for this pt. Reviewed most recent results, triglyceride and LDL levels were elevated, and HDL levels were low. Informed pt that alcohol cause cause triglyceride levels to increase, as well as too many saturated/trans fats which can also cause abnormalities with LDL and HDL levels. An efficient way to increase these numbers is exercise. We will continue routine screening as patient continues to achieve health goals along their weight loss journey.    B12 deficiency - new onset Assessment & Plan: Lab Results  Component Value Date   VITAMINB12 186 (L) 06/20/2023   FOLATE 4.9 06/20/2023  Per last labs, Brian Lam's B12 levels were extremely low and folate levels were at a low normal. Educated pt on importance of optimal B12 levels for energy, nerve function, and blood counts. Will start on B complex with [phone] mcg of B12 daily to improve these levels. Pt is agreeable to starting new supplement. Will recheck levels regularly every 3 months to observe any changes.  Relevant Orders: -     B Complex Vitamins; Take 1 capsule by mouth daily. 500 mcg-1000 mcg B12 daily   Vitamin D  deficiency - new onset Assessment & Plan: Lab Results  Component Value Date   VD25OH 27.4 (L) 06/20/2023  Brian Lam's most recent lab results show that his Vit D levels were below the goal of 50-70. I discussed the importance of vitamin D  to the patient's health and well-being. Informed him that Vit D is the only vitamin not naturally found in foods and low  levels can increase hunger, lethargic feeling, and negatively affect mood. Will start on ERGO 50,000 units once weekly. Weight loss will likely improve availability of vitamin D , thus encouraged Brian Lam to continue with meal plan and their weight loss efforts to further improve this condition. Thus, we will need to monitor levels regularly to keep levels within normal limits and prevent over supplementation.  Relevant Orders: -     Vitamin D  (Ergocalciferol ); Take 1 capsule (50,000 Units total) by mouth every 7 (seven) days.  Dispense: 4 capsule; Refill: 1   Elevated serum creatinine Assessment & Plan: Lab Results  Component Value Date   CREATININE 1.29 (H) 06/20/2023   BUN 15 06/20/2023   NA 140 06/20/2023   K 4.4 06/20/2023   CL 104 06/20/2023   CO2 23 06/20/2023  In 09/2020, Creatinine levels were 1.15. Most recent labs results show an increased level of 1.29. Pt was on chronic NSAID use daily for  6 weeks for treatment of tennis elbow under supervision of orthopedist which could have contributed to high serum creatinine levels. Levels are possibly currently elevated d/t use of NSAIDS and h/o and nephrolithiasis. These results can indicate impaired kidney function. Recommended pt to avoid NSAIDS as they can further affect the kidneys. Encouraged to use Tylenol  as an alternative. Also, ensure proper hydration is a priority. Will continue to monitor levels closely as it pertains to his weight loss journey.    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 Micael Adas of cardiology. Reviewed sleep study notes done on 03/13/2023, pt was found to have severe OSA. He had an AHI of 57.7/hr, severe snoring, a total of 11 awakenings, and O2 saturations less than 80% for 22.6 minutes. Stressed importance of wearing CPAP nightly to ensure condition is well controlled. Informed pt that weight loss can be significantly delayed if OSA poorly  controlled. Will monitor alongside cardiologist.   FOLLOW UP:   Return in about 3 weeks (around 07/25/2023). He was informed of the importance of frequent follow up visits to maximize his success with intensive lifestyle modifications for his multiple health conditions.  Subjective:   Chief complaint: Obesity Brian Lam is here to discuss his progress with his obesity treatment plan. He is on the the Category 4 Plan with [employer] options and states he is following his eating plan approximately 100% of the time. He states he is walking a mile 7 days per week.  Interval History:  Brian Lam is here today for his first follow-up office visit since starting the program with us . Patient is off to a good start and has lost 8 lbs. He endorses eating all foods according to MP. He was struggling a bit at first, but it was easier as he kept following the plan. Pt has not been using much of snack calories as he is having a hard time finding "healthy snacks". Additionally, he is eating red meat about 3 days per week.  All blood work/ lab tests that were recently ordered by myself or an outside provider were reviewed with patient today per their request. Extended time was spent counseling him on all new disease processes that were discovered or preexisting ones that are affected by BMI.  he understands that many of these abnormalities will need to monitored regularly along with the current treatment plan of prudent dietary changes, in which we are making each and every office visit, to improve these health parameters.  Pharmacotherapy for weight loss: He is not currently taking medications  for medical weight loss.   Review of Systems:  Pertinent positives were addressed with patient today.  Reviewed by clinician on day of visit: allergies, medications, problem list, medical history, surgical history, family history, social history, and previous encounter notes.   Weight Summary and Biometrics   Weight Lost  Since Last Visit: 8lb  Weight Gained Since Last Visit: 0lb   Vitals Temp: 97.6 F (36.4 C) BP: 107/68 Pulse Rate: 82 SpO2: 92 %   Anthropometric Measurements Height: 6' (1.829 m) Weight: (!) 323 lb (146.5 kg) BMI (Calculated): 43.8 Weight at Last Visit: 335lb Weight Lost Since Last Visit: 8lb Weight Gained Since Last Visit: 0lb Starting Weight: 331lb Total Weight Loss (lbs): 8 lb (3.629 kg) Peak Weight: 335lb Waist Measurement : 54 inches   Body Composition  Body Fat %: 36.8 % Fat Mass (lbs): 118.8 lbs Muscle Mass (lbs): 194.2 lbs Total Body  Water  (lbs): 149 lbs Visceral Fat Rating : 22   Other Clinical Data Fasting: No Labs: No Today's Visit #: #2 Starting Date: 06/20/23     Objective:   PHYSICAL EXAM:  Blood pressure 107/68, pulse 82, temperature 97.6 F (36.4 C), height 6' (1.829 m), weight (!) 323 lb (146.5 kg), SpO2 92%. Body mass index is 43.81 kg/m.  General: he is overweight, cooperative and in no acute distress.   HEENT: EOMI, sclerae are anicteric. Lungs: Normal breathing effort, no conversational dyspnea. M-Sk:  Normal gross ROM * 4 extremities  PSYCH: Has normal mood, affect and thought process. Neurologic: No gross sensory or motor deficits. Well developed, A and O * 3  DIAGNOSTIC DATA REVIEWED:  BMET    Component Value Date/Time   NA 140 06/20/2023 0901   K 4.4 06/20/2023 0901   CL 104 06/20/2023 0901   CO2 23 06/20/2023 0901   GLUCOSE 93 06/20/2023 0901   GLUCOSE 91 01/10/2023 1230   BUN 15 06/20/2023 0901   CREATININE 1.29 (H) 06/20/2023 0901   CALCIUM 8.9 06/20/2023 0901   GFRNONAA >60 01/10/2023 1230   Lab Results  Component Value Date   HGBA1C 5.8 (H) 06/20/2023   HGBA1C 5.9 06/12/2021   Lab Results  Component Value Date   INSULIN  35.5 (H) 06/20/2023   Lab Results  Component Value Date   TSH 1.870 06/20/2023   CBC    Component Value Date/Time   WBC 6.1 06/20/2023 0901   WBC 8.4 11/26/2022 0648   RBC 4.80  06/20/2023 0901   RBC 4.77 11/26/2022 0648   HGB 13.7 06/20/2023 0901   HCT 41.4 06/20/2023 0901   PLT 202 06/20/2023 0901   MCV 86 06/20/2023 0901   MCH 28.5 06/20/2023 0901   MCH 28.5 11/26/2022 0648   MCHC 33.1 06/20/2023 0901   MCHC 34.3 11/26/2022 0648   RDW 13.4 06/20/2023 0901   Iron Studies No results found for: "IRON", "TIBC", "FERRITIN", "IRONPCTSAT" Lipid Panel     Component Value Date/Time   CHOL 181 06/20/2023 0901   TRIG 220 (H) 06/20/2023 0901   HDL 37 (L) 06/20/2023 0901   CHOLHDL 4.9 06/20/2023 0901   CHOLHDL 5 06/12/2021 0923   VLDL 34.0 06/12/2021 0923   LDLCALC 106 (H) 06/20/2023 0901   Hepatic Function Panel     Component Value Date/Time   PROT 6.3 06/20/2023 0901   ALBUMIN 4.4 06/20/2023 0901   AST 23 06/20/2023 0901   ALT 31 06/20/2023 0901   ALKPHOS 48 06/20/2023 0901   BILITOT 0.4 06/20/2023 0901   BILIDIR 0.1 11/26/2022 0648   IBILI 0.4 11/26/2022 0648      Component Value Date/Time   TSH 1.870 06/20/2023 0901   Nutritional Lab Results  Component Value Date   VD25OH 27.4 (L) 06/20/2023    Attestations:   I, Camryn Mix, acting as a Stage manager for [employer], DO., have compiled all relevant documentation for today's office visit on behalf of Marceil Sensor, DO, while in the presence of [employer], DO.  Reviewed by clinician on day of visit: allergies, medications, problem list, medical history, surgical history, family history, social history, and previous encounter notes pertinent to patient's obesity diagnosis.   I have spent 60 minutes in the care of the patient today. Specifically: 3 minutes was spent before the visit reviewing the chart. 54 minutes was spent on face-to-face assessing and reviewing listed medical problems above as outlined in office visit note, reviewing labs, reviewing dietary changes  based on diagnoses above, providing nutritional and behavioral counseling as outlined in obesity care plan, independently  interpreting results and goals of care, see listed medical problems. All his questions were answered today. 3 minutes was spent  after the visit on additional documentation and chart review.   I have reviewed the above documentation for accuracy and completeness, and I agree with the above. Rae Bugler, D.O.  The 21st Century Cures Act was signed into law in 2016 which includes the topic of electronic health records.  This provides immediate access to information in MyChart.  This includes consultation notes, operative notes, office notes, lab results and pathology reports.  If you have any questions about what you read please let us  know at your next visit so we can discuss your concerns and take corrective action if need be.  We are right here with you.

## 2023-07-09 ENCOUNTER — Encounter: Payer: Self-pay | Admitting: Family Medicine

## 2023-07-11 ENCOUNTER — Ambulatory Visit (INDEPENDENT_AMBULATORY_CARE_PROVIDER_SITE_OTHER): Admitting: Family Medicine

## 2023-07-17 ENCOUNTER — Encounter: Payer: Self-pay | Admitting: Family Medicine

## 2023-07-18 MED ORDER — FLUOXETINE HCL 40 MG PO CAPS: 40.0000 mg | ORAL_CAPSULE | Freq: Every day | ORAL | 0 refills | Status: DC

## 2023-07-25 ENCOUNTER — Ambulatory Visit (INDEPENDENT_AMBULATORY_CARE_PROVIDER_SITE_OTHER): Admitting: Family Medicine

## 2023-07-25 ENCOUNTER — Encounter (INDEPENDENT_AMBULATORY_CARE_PROVIDER_SITE_OTHER): Payer: Self-pay | Admitting: Family Medicine

## 2023-07-25 VITALS — BP 134/78 | HR 71 | Temp 98.1°F | Ht 72.0 in | Wt 313.0 lb

## 2023-07-25 DIAGNOSIS — E559 Vitamin D deficiency, unspecified: Secondary | ICD-10-CM | POA: Diagnosis not present

## 2023-07-25 DIAGNOSIS — R7303 Prediabetes: Secondary | ICD-10-CM

## 2023-07-25 DIAGNOSIS — Z6841 Body Mass Index (BMI) 40.0 and over, adult: Secondary | ICD-10-CM

## 2023-07-25 MED ORDER — VITAMIN D (ERGOCALCIFEROL) 1.25 MG (50000 UNIT) PO CAPS
50000.0000 [IU] | ORAL_CAPSULE | ORAL | 1 refills | Status: DC
Start: 2023-07-25 — End: 2023-08-25

## 2023-07-25 NOTE — Progress Notes (Signed)
 Brian Lam, D.O.  ABFM, ABOM Specializing in Clinical Bariatric Medicine  Office located at: [address] [place], [place]  [address]   Assessment and Plan:   Medications Discontinued During This Encounter  Medication Reason   Vitamin D , Ergocalciferol , (DRISDOL ) 1.25 MG (50000 UNIT) CAPS capsule Reorder     Meds ordered this encounter  Medications   Vitamin D , Ergocalciferol , (DRISDOL ) 1.25 MG (50000 UNIT) CAPS capsule    Sig: Take 1 capsule (50,000 Units total) by mouth every 7 (seven) days.    Dispense:  4 capsule    Refill:  1    FOR THE DISEASE OF OBESITY:  BMI 40.0-44.9, adult (HCC) - Current 42.44 Morbid obesity -- starting bmi 44.88 Assessment & Plan: Since last office visit on 07/04/2023 patient's  Muscle mass has decreased by 1.2 lb. Fat mass has decreased by 7.8 lb. Total body water  has decreased by 3.6 lb.  Counseling done on how various foods will affect these numbers and how to maximize success  Total lbs lost to date: 18 lbs  Total weight loss percentage to date: 5.44%    Recommended Dietary Goals Brian Lam is currently in the action stage of change. As such, his goal is to continue weight management plan.  He has agreed to: continue current plan   Behavioral Intervention We discussed the following today: continue to work on maintaining a reduced calorie state, getting the recommended amount of protein, incorporating whole foods, making healthy choices, staying well hydrated and practicing mindfulness when eating.  Additional resources provided today: None  Evidence-based interventions for health behavior change were utilized today including the discussion of self monitoring techniques, problem-solving barriers and SMART goal setting techniques.   Regarding patient's less desirable eating habits and patterns, we employed the technique of small changes.   Pt will specifically work on: n/a   Recommended Physical Activity Goals Brian Lam has been  advised to work up to 300-450 minutes of moderate intensity aerobic activity a week and strengthening exercises 2-3 times per week for cardiovascular health, weight loss maintenance and preservation of muscle mass.   He has agreed to : continue to gradually increase the amount and intensity of exercise routine   Pharmacotherapy We both agreed to : continue same regimen   ASSOCIATED CONDITIONS ADDRESSED TODAY:  Pre-diabetes Assessment & Plan: Most recent A1c and fasting insulin :  Lab Results  Component Value Date   HGBA1C 5.8 (H) 06/20/2023   HGBA1C 5.9 06/12/2021   INSULIN  35.5 (H) 06/20/2023    Diet/lifestyle approach. States he is feeling better physically and mood wise b/c of his healthier eating. He is doing well with his lean protein intake. His hunger and cravings are under good control. Continue working on nutrition plan to decrease simple carbohydrates, increase lean proteins and exercise to promote weight loss and improve glycemic control and prevent progression to T2DM. Losing 10% or more of body weight may improve condition.   Vitamin D  deficiency Assessment & Plan: Most recent Vitamin D :  Lab Results  Component Value Date   VD25OH 27.4 (L) 06/20/2023   Currently on ergocalciferol  50,000 units wkly without any adverse effects such as nausea, vomiting or muscle weakness.  Continue vitamin D  supplementation - recheck levels in the future   Follow up:   Return 08/25/2023 at 3:20 PM. He was informed of the importance of frequent follow up visits to maximize his success with intensive lifestyle modifications for his multiple health conditions.  Subjective:   Chief complaint: Obesity  Brian Lam is here to discuss his progress with his obesity treatment plan. He is on the Category 4 Plan with [employer] options and states he is following his eating plan approximately 95% of the time. He states he is walking 45 minutes 5 days per week.  Interval History:  Brian Lam is here for a  follow up office visit. Since last OV on 07/04/2023, Brian Lam is down 10 lbs. States he is feeling better physically and mood wise.  He is doing well with his lean protein intake. His hunger and cravings are controlled. Is drinking 100 - 144 ounces of water  daily.   Pharmacotherapy that can aid with weight loss: He is currently taking Topiramate  25 mg twice daily  Review of Systems:  Pertinent positives were addressed with patient today. Reviewed by clinician on day of visit: allergies, medications, problem list, medical history, surgical history, family history, social history, and previous encounter notes.  Weight Summary and Biometrics   Weight Lost Since Last Visit: 10lb  Weight Gained Since Last Visit: 0   Vitals Temp: 98.1 F (36.7 C) BP: 134/78 Pulse Rate: 71 SpO2: 98 %   Anthropometric Measurements Height: 6' (1.829 m) Weight: (!) 313 lb (142 kg) BMI (Calculated): 42.44 Weight at Last Visit: 323lb Weight Lost Since Last Visit: 10lb Weight Gained Since Last Visit: 0 Starting Weight: 331lb Total Weight Loss (lbs): 18 lb (8.165 kg) Peak Weight: 335lb   Body Composition  Body Fat %: 35.4 % Fat Mass (lbs): 111 lbs Muscle Mass (lbs): 193 lbs Total Body Water  (lbs): 145.4 lbs Visceral Fat Rating : 20   Other Clinical Data Fasting: no Labs: no Today's Visit #: 3 Starting Date: 06/20/23   Objective:   PHYSICAL EXAM: Blood pressure 134/78, pulse 71, temperature 98.1 F (36.7 C), height 6' (1.829 m), weight (!) 313 lb (142 kg), SpO2 98%. Body mass index is 42.45 kg/m.  General: he is overweight, cooperative and in no acute distress. PSYCH: Has normal mood, affect and thought process.   HEENT: EOMI, sclerae are anicteric. Lungs: Normal breathing effort, no conversational dyspnea. Extremities: Moves * 4 Neurologic: A and O * 3, good insight  DIAGNOSTIC DATA REVIEWED: BMET    Component Value Date/Time   NA 140 06/20/2023 0901   K 4.4 06/20/2023 0901    CL 104 06/20/2023 0901   CO2 23 06/20/2023 0901   GLUCOSE 93 06/20/2023 0901   GLUCOSE 91 01/10/2023 1230   BUN 15 06/20/2023 0901   CREATININE 1.29 (H) 06/20/2023 0901   CALCIUM 8.9 06/20/2023 0901   GFRNONAA >60 01/10/2023 1230   Lab Results  Component Value Date   HGBA1C 5.8 (H) 06/20/2023   HGBA1C 5.9 06/12/2021   Lab Results  Component Value Date   INSULIN  35.5 (H) 06/20/2023   Lab Results  Component Value Date   TSH 1.870 06/20/2023   CBC    Component Value Date/Time   WBC 6.1 06/20/2023 0901   WBC 8.4 11/26/2022 0648   RBC 4.80 06/20/2023 0901   RBC 4.77 11/26/2022 0648   HGB 13.7 06/20/2023 0901   HCT 41.4 06/20/2023 0901   PLT 202 06/20/2023 0901   MCV 86 06/20/2023 0901   MCH 28.5 06/20/2023 0901   MCH 28.5 11/26/2022 0648   MCHC 33.1 06/20/2023 0901   MCHC 34.3 11/26/2022 0648   RDW 13.4 06/20/2023 0901   Iron Studies No results found for: "IRON", "TIBC", "FERRITIN", "IRONPCTSAT" Lipid Panel     Component Value Date/Time   CHOL 181  06/20/2023 0901   TRIG 220 (H) 06/20/2023 0901   HDL 37 (L) 06/20/2023 0901   CHOLHDL 4.9 06/20/2023 0901   CHOLHDL 5 06/12/2021 0923   VLDL 34.0 06/12/2021 0923   LDLCALC 106 (H) 06/20/2023 0901   Hepatic Function Panel     Component Value Date/Time   PROT 6.3 06/20/2023 0901   ALBUMIN 4.4 06/20/2023 0901   AST 23 06/20/2023 0901   ALT 31 06/20/2023 0901   ALKPHOS 48 06/20/2023 0901   BILITOT 0.4 06/20/2023 0901   BILIDIR 0.1 11/26/2022 0648   IBILI 0.4 11/26/2022 0648      Component Value Date/Time   TSH 1.870 06/20/2023 0901   Nutritional Lab Results  Component Value Date   VD25OH 27.4 (L) 06/20/2023    Attestations:   I, Special Puri, acting as a Stage manager for Marceil Sensor, DO., have compiled all relevant documentation for today's office visit on behalf of Marceil Sensor, DO, while in the presence of [employer], DO.  I have reviewed the above documentation for accuracy and  completeness, and I agree with the above. Brian Lam, D.O.  The 21st Century Cures Act was signed into law in 2016 which includes the topic of electronic health records.  This provides immediate access to information in MyChart.  This includes consultation notes, operative notes, office notes, lab results and pathology reports.  If you have any questions about what you read please let us  know at your next visit so we can discuss your concerns and take corrective action if need be.  We are right here with you.

## 2023-08-01 ENCOUNTER — Telehealth: Admitting: Family Medicine

## 2023-08-01 ENCOUNTER — Encounter: Payer: Self-pay | Admitting: Family Medicine

## 2023-08-01 ENCOUNTER — Encounter (INDEPENDENT_AMBULATORY_CARE_PROVIDER_SITE_OTHER): Payer: Self-pay | Admitting: Family Medicine

## 2023-08-01 DIAGNOSIS — F339 Major depressive disorder, recurrent, unspecified: Secondary | ICD-10-CM | POA: Diagnosis not present

## 2023-08-01 DIAGNOSIS — G43109 Migraine with aura, not intractable, without status migrainosus: Secondary | ICD-10-CM | POA: Diagnosis not present

## 2023-08-01 MED ORDER — FLUOXETINE HCL 40 MG PO CAPS: 40.0000 mg | ORAL_CAPSULE | Freq: Every day | ORAL | 3 refills | Status: AC

## 2023-08-01 NOTE — Progress Notes (Signed)
 Patient ID: Brian Lam, male   DOB: [birth year], 38 y.o.   MRN: [id number]   Virtual Visit via Video Note  I connected with Maryelizabeth Smolder on 08/01/23 at  9:15 AM EDT by a video enabled telemedicine application and verified that I am speaking with the correct person using two identifiers.  Location patient: home Location provider:work or home office Persons participating in the virtual visit: patient, provider  I discussed the limitations of evaluation and management by telemedicine and the availability of in person appointments. The patient expressed understanding and agreed to proceed.   HPI:  Brian Lam had called requesting refill of Prozac  40 mg daily.  He has longstanding history of recurrent depression.  About a year ago we increased his dose to 40 mg and he feels this has helped.  Denies any side effects.  He is getting regular counseling including some couples counseling.  Overall, he feels things are going well.  He is attending weight management clinic and has lost about 20 pounds and is excited about that.  Walking 1 hour about 5 days/week.  He has history of migraine headaches and currently on Topamax  50 mg daily which seems to be helping.  He knows this may be helping with some appetite suppression as well.   ROS: See pertinent positives and negatives per HPI.  Past Medical History:  Diagnosis Date   Back pain    Chest pain    Chronic knee pain    Contact dermatitis    Depression    Depression    GERD (gastroesophageal reflux disease)    GERD (gastroesophageal reflux disease)    History of kidney stones    History of stomach ulcers    IBS (irritable bowel syndrome)    Joint pain    Kidney problem    Migraines    Pneumonia    Pre-diabetes    Pre-diabetes    Shortness of breath    Sleep apnea     Past Surgical History:  Procedure Laterality Date   CYSTOSCOPY/URETEROSCOPY/HOLMIUM LASER/STENT PLACEMENT Right 01/10/2023   Procedure: CYSTOSCOPY, RIGHT RETROGRADE  PYELOGRAM, RIGHT URETEROSCOPY, HOLMIUM LASER LITHOTRIPSY, AND RIGHT URETERAL STENT PLACEMENT;  Surgeon: Lahoma Pigg, MD;  Location: [facility];  Service: Urology;  Laterality: Right;  60 MINUTES   VASECTOMY  2020    Family History  Problem Relation Age of Onset   Obesity Mother    Sleep apnea Mother    Anxiety disorder Mother    Depression Mother    Thyroid  disease Mother    Obesity Father    Kidney disease Father    Hyperlipidemia Father    Diabetes Father    Diabetes type II Father    Sleep apnea Father    Heart attack Maternal Grandfather    Diabetes Maternal Grandfather    Colon cancer Neg Hx    Esophageal cancer Neg Hx    Stomach cancer Neg Hx    Rectal cancer Neg Hx     SOCIAL HX: Non-smoker   Current Outpatient Medications:    albuterol (VENTOLIN HFA) 108 (90 Base) MCG/ACT inhaler, Inhale 1-2 puffs into the lungs as needed for wheezing or shortness of breath., Disp: , Rfl:    b complex vitamins capsule, Take 1 capsule by mouth daily. 500 mcg-1000 mcg B12 daily, Disp: , Rfl:    famotidine (PEPCID) 20 MG tablet, Take 20 mg by mouth at bedtime., Disp: , Rfl:    omeprazole  (PRILOSEC) 20 MG capsule, Take 1 tablet by mouth twice  daily (Patient taking differently: Take 20 mg by mouth in the morning and at bedtime.), Disp: 180 capsule, Rfl: 0   oxyCODONE  (ROXICODONE ) 5 MG immediate release tablet, Take 1 tablet (5 mg total) by mouth every 6 (six) hours as needed for severe pain., Disp: 15 tablet, Rfl: 0   propranolol  ER (INDERAL  LA) 60 MG 24 hr capsule, Take 1 capsule by mouth once daily for 14 days and then discontinue, Disp: 14 capsule, Rfl: 0   Rimegepant Sulfate (NURTEC) 75 MG TBDP, Take 1 tablet by mouth at onset of migraine and may repeat 1 in 2 hours as needed but no more than 2 in 24 hours, Disp: 8 tablet, Rfl: 5   tamsulosin  (FLOMAX ) 0.4 MG CAPS capsule, Take 1 capsule (0.4 mg total) by mouth daily after supper., Disp: 10 capsule, Rfl: 0   topiramate  (TOPAMAX ) 25 MG  tablet, Take 1 tablet by mouth nightly for 1 week and then increase to 2 tablets by mouth each night, Disp: 60 tablet, Rfl: 2   Vitamin D , Ergocalciferol , (DRISDOL ) 1.25 MG (50000 UNIT) CAPS capsule, Take 1 capsule (50,000 Units total) by mouth every 7 (seven) days., Disp: 4 capsule, Rfl: 1   FLUoxetine  (PROZAC ) 40 MG capsule, Take 1 capsule (40 mg total) by mouth daily., Disp: 90 capsule, Rfl: 3  EXAM:  VITALS per patient if applicable:  GENERAL: alert, oriented, appears well and in no acute distress  HEENT: atraumatic, conjunttiva clear, no obvious abnormalities on inspection of external nose and ears  NECK: normal movements of the head and neck  LUNGS: on inspection no signs of respiratory distress, breathing rate appears normal, no obvious gross SOB, gasping or wheezing  CV: no obvious cyanosis  MS: moves all visible extremities without noticeable abnormality  PSYCH/NEURO: pleasant and cooperative, no obvious depression or anxiety, speech and thought processing grossly intact  ASSESSMENT AND PLAN:  Discussed the following assessment and plan:  #1 recurrent depression-stable on fluoxetine  40 mg daily.  Continue current dose.  Continue regular counseling.  Continue regular exercise.  Refill fluoxetine  for 1 year.  Recommend indefinite use with his history of recurrent depression  #2 migraine headaches.  Currently stable on Topamax .  Has not required any recent Nurtec.  Continue Topamax  50 mg daily.  We discussed the fact this could be titrated to twice daily in the future if necessary.     I discussed the assessment and treatment plan with the patient. The patient was provided an opportunity to ask questions and all were answered. The patient agreed with the plan and demonstrated an understanding of the instructions.   The patient was advised to call back or seek an in-person evaluation if the symptoms worsen or if the condition fails to improve as anticipated.     Brian Lamy, MD

## 2023-08-02 ENCOUNTER — Ambulatory Visit: Attending: Cardiology | Admitting: Cardiology

## 2023-08-02 VITALS — Ht 72.0 in

## 2023-08-02 DIAGNOSIS — G4733 Obstructive sleep apnea (adult) (pediatric): Secondary | ICD-10-CM

## 2023-08-02 NOTE — Progress Notes (Signed)
 This encounter was created in error - please disregard.

## 2023-08-05 ENCOUNTER — Encounter: Payer: Self-pay | Admitting: Cardiology

## 2023-08-10 ENCOUNTER — Ambulatory Visit: Attending: Cardiology | Admitting: Cardiology

## 2023-08-10 ENCOUNTER — Encounter: Payer: Self-pay | Admitting: Cardiology

## 2023-08-10 VITALS — BP 93/60 | HR 75 | Ht 72.0 in | Wt 315.0 lb

## 2023-08-10 DIAGNOSIS — G4733 Obstructive sleep apnea (adult) (pediatric): Secondary | ICD-10-CM | POA: Diagnosis not present

## 2023-08-10 NOTE — Patient Instructions (Signed)
 Follow-Up: At [facility], you and your health needs are our priority.  As part of our continuing mission to provide you with exceptional heart care, our providers are all part of one team.  This team includes your primary Cardiologist (physician) and Advanced Practice Providers or APPs (Physician Assistants and Nurse Practitioners) who all work together to provide you with the care you need, when you need it.  Your next appointment:   1 year(s)  Provider:   Gaylyn Keas, MD

## 2023-08-10 NOTE — Progress Notes (Unsigned)
 Sleep Medicine CONSULT Note    Date:  08/10/2023   ID:  Brian Lam, DOB [birth year], MRN [id number]  PCP:  Brian Sit, MD  Cardiologist: None   Chief Complaint  Patient presents with   New Patient (Initial Visit)    Obstructive sleep apnea    History of Present Illness:  Brian Lam is a 38 y.o. male who is being seen today for the evaluation of OSA at the request of Brian Mass, MD.  This is a 38 year old male with a history of GERD and chest pain who was seen by Brian Lam.  He also is morbidly obese with a BMI of 43.  When he was seen by him he complained of significant episodes of snoring.  And excessive da  ytime fatigue.  He says he has problems staying awake and can fall asleep very easily.  A home sleep study was ordered which showed severe obstructive sleep apnea with an AHI 57.7/h and nocturnal hypoxemia for O2 saturations less than 88% for 22.6 minutes.  In-lab CPAP titration was recommended but his insurance would not pay for it.  He was started on auto CPAP from 4 to 15 cm H2O.  He is now referred for sleep medicine consultation to establish treatment and care of obstructive sleep apnea.  He is doing well with he PAP device and thinks that he has gotten used to it.  He tolerates the full face mask and feels the pressure is adequate.  Since going on PAP he feels rested in the am and has no significant daytime sleepiness.  He denies any significant mouth or nasal dryness or nasal congestion.  He does not think that he snores.     Past Medical History:  Diagnosis Date   Back pain    Chest pain    Chronic knee pain    Contact dermatitis    Depression    GERD (gastroesophageal reflux disease)    History of kidney stones    History of stomach ulcers    IBS (irritable bowel syndrome)    Migraines    OSA on CPAP    severe obstructive sleep apnea with an AHI 57.7/h and nocturnal hypoxemia for O2 saturations less than 88% for 22.6 minutes.   Pneumonia     Pre-diabetes    Shortness of breath     Past Surgical History:  Procedure Laterality Date   CYSTOSCOPY/URETEROSCOPY/HOLMIUM LASER/STENT PLACEMENT Right 01/10/2023   Procedure: CYSTOSCOPY, RIGHT RETROGRADE PYELOGRAM, RIGHT URETEROSCOPY, HOLMIUM LASER LITHOTRIPSY, AND RIGHT URETERAL STENT PLACEMENT;  Surgeon: Brian Pigg, MD;  Location: [facility];  Service: Urology;  Laterality: Right;  60 MINUTES   VASECTOMY  2020    Current Medications: Current Meds  Medication Sig   b complex vitamins capsule Take 1 capsule by mouth daily. 500 mcg-1000 mcg B12 daily   famotidine (PEPCID) 20 MG tablet Take 20 mg by mouth at bedtime.   FLUoxetine  (PROZAC ) 40 MG capsule Take 1 capsule (40 mg total) by mouth daily.   omeprazole  (PRILOSEC) 20 MG capsule Take 1 tablet by mouth twice daily   Rimegepant Sulfate (NURTEC) 75 MG TBDP Take 1 tablet by mouth at onset of migraine and may repeat 1 in 2 hours as needed but no more than 2 in 24 hours   topiramate  (TOPAMAX ) 25 MG tablet Take 1 tablet by mouth nightly for 1 week and then increase to 2 tablets by mouth each night   Vitamin D , Ergocalciferol , (DRISDOL ) 1.25  MG (50000 UNIT) CAPS capsule Take 1 capsule (50,000 Units total) by mouth every 7 (seven) days.    Allergies:   Patient has no known allergies.   Social History   Socioeconomic History   Marital status: Married    Spouse name: Not on file   Number of children: 3   Years of education: Not on file   Highest education level: Not on file  Occupational History   Occupation: Homicide Detective  Tobacco Use   Smoking status: Never   Smokeless tobacco: Never  Vaping Use   Vaping status: Never Used  Substance and Sexual Activity   Alcohol use: Yes    Comment: 2 to 3 drinks a week   Drug use: No   Sexual activity: Not on file  Other Topics Concern   Not on file  Social History Narrative   Not on file   Social Drivers of Health   Financial Resource Strain: Not on file  Food Insecurity:  Not on file  Transportation Needs: Not on file  Physical Activity: Not on file  Stress: Not on file  Social Connections: Not on file     Family History:  The patient's family history includes Anxiety disorder in his mother; Depression in his mother; Diabetes in his father and maternal grandfather; Diabetes type II in his father; Heart attack in his maternal grandfather; Hyperlipidemia in his father; Kidney disease in his father; Obesity in his father and mother; Sleep apnea in his father and mother; Thyroid  disease in his mother.   ROS:   Please see the history of present illness.    ROS All other systems reviewed and are negative.      No data to display             PHYSICAL EXAM:   VS:  BP 93/60 (BP Location: Left Arm)   Pulse 75   Ht 6' (1.829 m)   Wt (!) 315 lb (142.9 kg)   SpO2 95%   BMI 42.72 kg/m    GEN: Well nourished, well developed, in no acute distress  HEENT: normal  Neck: no JVD, carotid bruits, or masses Cardiac: RRR; no murmurs, rubs, or gallops,no edema.  Intact distal pulses bilaterally.  Respiratory:  clear to auscultation bilaterally, normal work of breathing GI: soft, nontender, nondistended, + BS MS: no deformity or atrophy  Skin: warm and dry, no rash Neuro:  Alert and Oriented x 3, Strength and sensation are intact Psych: euthymic mood, full affect  Wt Readings from Last 3 Encounters:  08/10/23 (!) 315 lb (142.9 kg)  07/25/23 (!) 313 lb (142 kg)  07/04/23 (!) 323 lb (146.5 kg)      Studies/Labs Reviewed:   Home sleep study, PAP compliance download  Recent Labs: 06/20/2023: ALT 31; BUN 15; Creatinine, Ser 1.29; Hemoglobin 13.7; Platelets 202; Potassium 4.4; Sodium 140; TSH 1.870     ASSESSMENT:    1. OSA (obstructive sleep apnea)   2. OSA on CPAP      PLAN:  In order of problems listed above:  OSA - The patient is tolerating PAP therapy well without any problems. The PAP download performed by his DME was personally reviewed and  interpreted by me today and showed an AHI of 3.3 /hr on auto CPAP and 4-15 cm H2O with 100% compliance in using more than 4 hours nightly.  The patient has been using and benefiting from PAP use and will continue to benefit from therapy.    Time Spent: 20  minutes total time of encounter, including 15 minutes spent in face-to-face patient care on the date of this encounter. This time includes coordination of care and counseling regarding above mentioned problem list. Remainder of non-face-to-face time involved reviewing chart documents/testing relevant to the patient encounter and documentation in the medical record. I have independently reviewed documentation from referring provider  Medication Adjustments/Labs and Tests Ordered: Current medicines are reviewed at length with the patient today.  Concerns regarding medicines are outlined above.  Medication changes, Labs and Tests ordered today are listed in the Patient Instructions below.  Patient Instructions  Medication Instructions:  *** *If you need a refill on your cardiac medications before your next appointment, please call your pharmacy*  Lab Work: *** If you have labs (blood work) drawn today and your tests are completely normal, you will receive your results only by: MyChart Message (if you have MyChart) OR A paper copy in the mail If you have any lab test that is abnormal or we need to change your treatment, we will call you to review the results.  Testing/Procedures: ***  Follow-Up: At [facility], you and your health needs are our priority.  As part of our continuing mission to provide you with exceptional heart care, our providers are all part of one team.  This team includes your primary Cardiologist (physician) and Advanced Practice Providers or APPs (Physician Assistants and Nurse Practitioners) who all work together to provide you with the care you need, when you need it.  Your next appointment:   {numbers  1-12:[id number]} {Time; day/wk/mo/yr(s):9076}  Provider:   {Providers/Teams        :[id number]} {If no MD populates, click here to update Cardiologist or EP   DO NOT delete brackets or number around this link :1}   We recommend signing up for the patient portal called "MyChart".  Sign up information is provided on this After Visit Summary.  MyChart is used to connect with patients for Virtual Visits (Telemedicine).  Patients are able to view lab/test results, encounter notes, upcoming appointments, etc.  Non-urgent messages can be sent to your provider as well.   To learn more about what you can do with MyChart, go to [email or web address].   Other Instructions      Signed, Gaylyn Keas, MD  08/10/2023 3:42 PM    [facility] [address] [place], [place], [place]  [address] Phone: [phone]; Fax: [phone]

## 2023-08-19 ENCOUNTER — Ambulatory Visit: Payer: Self-pay

## 2023-08-19 NOTE — Telephone Encounter (Signed)
 Patient was seen in office on 5/28 with Dr. Micael Adas

## 2023-08-25 ENCOUNTER — Ambulatory Visit (INDEPENDENT_AMBULATORY_CARE_PROVIDER_SITE_OTHER): Admitting: Family Medicine

## 2023-08-25 ENCOUNTER — Encounter (INDEPENDENT_AMBULATORY_CARE_PROVIDER_SITE_OTHER): Payer: Self-pay | Admitting: Family Medicine

## 2023-08-25 VITALS — BP 109/70 | HR 78 | Temp 98.3°F | Ht 72.0 in | Wt 302.0 lb

## 2023-08-25 DIAGNOSIS — G43109 Migraine with aura, not intractable, without status migrainosus: Secondary | ICD-10-CM | POA: Diagnosis not present

## 2023-08-25 DIAGNOSIS — R7303 Prediabetes: Secondary | ICD-10-CM

## 2023-08-25 DIAGNOSIS — E538 Deficiency of other specified B group vitamins: Secondary | ICD-10-CM | POA: Diagnosis not present

## 2023-08-25 DIAGNOSIS — E559 Vitamin D deficiency, unspecified: Secondary | ICD-10-CM | POA: Diagnosis not present

## 2023-08-25 DIAGNOSIS — Z6841 Body Mass Index (BMI) 40.0 and over, adult: Secondary | ICD-10-CM

## 2023-08-25 MED ORDER — VITAMIN D (ERGOCALCIFEROL) 1.25 MG (50000 UNIT) PO CAPS: 50000.0000 [IU] | ORAL_CAPSULE | ORAL | 1 refills | Status: DC

## 2023-08-25 MED ORDER — TOPIRAMATE 25 MG PO TABS
ORAL_TABLET | ORAL | 1 refills | Status: DC
Start: 2023-08-25 — End: 2023-10-20

## 2023-08-25 NOTE — Progress Notes (Signed)
 Brian Lam, D.O.  ABFM, ABOM Specializing in Clinical Bariatric Medicine  Office located at: [address] [place], [place]  [address]   Assessment and Plan:  No orders of the defined types were placed in this encounter.   Medications Discontinued During This Encounter  Medication Reason   topiramate  (TOPAMAX ) 25 MG tablet Reorder   Vitamin D , Ergocalciferol , (DRISDOL ) 1.25 MG (50000 UNIT) CAPS capsule Reorder     Meds ordered this encounter  Medications   Vitamin D , Ergocalciferol , (DRISDOL ) 1.25 MG (50000 UNIT) CAPS capsule    Sig: Take 1 capsule (50,000 Units total) by mouth every 7 (seven) days.    Dispense:  4 capsule    Refill:  1   topiramate  (TOPAMAX ) 25 MG tablet    Sig: 1 po bid    Dispense:  60 tablet    Refill:  1    FOR THE DISEASE OF OBESITY: BMI 40.0-44.9, adult (HCC) - Current BMI 40.95 Morbid obesity -- starting bmi 44.88 Assessment & Plan: Since last office visit on 07/25/23 patient's muscle mass has decreased by 3.4 lbs. Fat mass has decreased by 7.4 lbs. Total body water  has decreased by 0.8 lbs.  Counseling done on how various foods will affect these numbers and how to maximize success  Total lbs lost to date: 29 lbs Total weight loss percentage to date: -8.76%    Recommended Dietary Goals Brian Lam is currently in the action stage of change. As such, his goal is to continue weight management plan.  He has agreed to: continue current plan   Behavioral Intervention We discussed the following today: increasing lean protein intake to established goals, decreasing simple carbohydrates , work on tracking and journaling calories using tracking application, focusing on food with a 10:1 ratio of calories: grams of protein, going to the [employer] for recipe ideas, and using GPT or another AI platform for recipe ideas- searching low calorie, low carb, high protein chicken recipes etc  Additional resources provided today: Handout on  complex carbohydrates and lean sources of protein, Handout on the concepts of Adaptive Thermogenesis, Physician provided patient with handouts and personalized instruction on tracking and journaling using Apps (or how to handwrite in notebook) and using logs provided , and Provided with personal guidance and instructions on how to use [employer] for healthy meal ideas and cooking in bulk.  Evidence-based interventions for health behavior change were utilized today including the discussion of self monitoring techniques, problem-solving barriers and SMART goal setting techniques.   Regarding patient's less desirable eating habits and patterns, we employed the technique of small changes.   Pt will specifically work on:  Use apps to journal daily intake for next visit.    Recommended Physical Activity Goals Shayn has been advised to work up to 300-450 minutes of moderate intensity aerobic activity a week and strengthening exercises 2-3 times per week for cardiovascular health, weight loss maintenance and preservation of muscle mass.   He has agreed to : Continue current level of physical activity    Pharmacotherapy We both agreed to: Continue with current nutritional and behavioral strategies  ASSOCIATED CONDITIONS ADDRESSED TODAY: Pre-diabetes Assessment & Plan: Lab Results  Component Value Date   HGBA1C 5.8 (H) 06/20/2023   HGBA1C 5.9 06/12/2021   INSULIN  35.5 (H) 06/20/2023    No meds currently; diet/exercise approach. Hunger/cravings are well controlled.Tend to stay well hydrated, drinking 120-144 ounces of water  per day. Continue efforts to decrease simple carbs/sugars and meet lean protein goals  per day.  Continue with current exercise routine.    B12 deficiency - new onset Assessment & Plan: Lab Results  Component Value Date   VITAMINB12 186 (L) 06/20/2023   Taking OTC B complex [phone] mcg daily. Tolerating well, no adverse side effects. No acute concerns. Reviewed ideal  B12 goal of 500 or greater, pt verbalized understanding. Continue supplementation as prescribed. Recheck periodically.    Vitamin D  deficiency Assessment & Plan: Lab Results  Component Value Date   VD25OH 27.4 (L) 06/20/2023   Compliant with ERGO 50K units once weekly. Tolerating well with no SE. No acute concerns. Continue with current supplementation, will refill today.  Orders: - Refill ERGO, no changes    Migraine with aura and without status migrainosus, not intractable Assessment & Plan: Pt is on Topamax  25 mg once nightly with good tolerance. Overall his migraines are well controlled, although he notes he has had to take Nurtec 1-2 times in the last few weeks. He attributes these recent episodes to increased work stress. Reviewed clinical benefits of Topamax  as well as potential risks/side effects. Discussed the option of increasing Topamax  for migraine management as well as help with hunger/cravings. Mutually agreed to increase Topamax  to 25 mg BID at this time. Will continue monitoring.   Orders: - INCREASE Topamax  --> Start taking 25 mg BID     Follow up:   Return in about 4 weeks (around 09/22/2023) for Keep upcoming appt, make another appt if pt desires . He was informed of the importance of frequent follow up visits to maximize his success with intensive lifestyle modifications for his multiple health conditions.  Subjective:   Chief complaint: Obesity Brian Lam is here to discuss his progress with his obesity treatment plan. He is on the Category 4 Plan with L options and states he is following his eating plan approximately 95% of the time. He states he is walking 60 minutes 3 days per week.  Interval History:  Brian Lam is here for a follow up office visit. Since last OV on , he is down 11 lbs. States he has overall been doing well. Has been journaling on his phone and plans to transition to using the [employer] app. Reports he has gotten a bit bored of the foods he  eats, stating he typically eats the same meals for breakfast and lunch. For dinner, he eats 8-10 ounces of chicken thigh or breast and 1 cup of green beans or cabbage. He has been struggling to eat all the protein during dinner, and would like to decrease protein intake to 6-8 ounces. Drinks 120-144 ounces of water  per day.   Pharmacotherapy for weight loss: He is currently taking Topamax  50 mg nightly.    Review of Systems:  Pertinent positives were addressed with patient today.  Reviewed by clinician on day of visit: allergies, medications, problem list, medical history, surgical history, family history, social history, and previous encounter notes.  Weight Summary and Biometrics   Weight Lost Since Last Visit: 11lb  Weight Gained Since Last Visit: 0lb   Vitals Temp: 98.3 F (36.8 C) BP: 109/70 Pulse Rate: 78 SpO2: 98 %   Anthropometric Measurements Height: 6' (1.829 m) Weight: (!) 302 lb (137 kg) BMI (Calculated): 40.95 Weight at Last Visit: 313lb Weight Lost Since Last Visit: 11lb Weight Gained Since Last Visit: 0lb Starting Weight: 331lb Total Weight Loss (lbs): 29 lb (13.2 kg) Peak Weight: 335lb   Body Composition  Body Fat %: 34.2 % Fat Mass (lbs): 103.6 lbs  Muscle Mass (lbs): 189.6 lbs Total Body Water  (lbs): 144.6 lbs Visceral Fat Rating : 19   Other Clinical Data Fasting: No Labs: no Today's Visit #: 4 Starting Date: 06/20/23    Objective:   PHYSICAL EXAM: Blood pressure 109/70, pulse 78, temperature 98.3 F (36.8 C), height 6' (1.829 m), weight (!) 302 lb (137 kg), SpO2 98%. Body mass index is 40.96 kg/m.  General: he is overweight, cooperative and in no acute distress. PSYCH: Has normal mood, affect and thought process.   HEENT: EOMI, sclerae are anicteric. Lungs: Normal breathing effort, no conversational dyspnea. Extremities: Moves * 4 Neurologic: A and O * 3, good insight  DIAGNOSTIC DATA REVIEWED: BMET    Component Value Date/Time    NA 140 06/20/2023 0901   K 4.4 06/20/2023 0901   CL 104 06/20/2023 0901   CO2 23 06/20/2023 0901   GLUCOSE 93 06/20/2023 0901   GLUCOSE 91 01/10/2023 1230   BUN 15 06/20/2023 0901   CREATININE 1.29 (H) 06/20/2023 0901   CALCIUM 8.9 06/20/2023 0901   GFRNONAA >60 01/10/2023 1230   Lab Results  Component Value Date   HGBA1C 5.8 (H) 06/20/2023   HGBA1C 5.9 06/12/2021   Lab Results  Component Value Date   INSULIN  35.5 (H) 06/20/2023   Lab Results  Component Value Date   TSH 1.870 06/20/2023   CBC    Component Value Date/Time   WBC 6.1 06/20/2023 0901   WBC 8.4 11/26/2022 0648   RBC 4.80 06/20/2023 0901   RBC 4.77 11/26/2022 0648   HGB 13.7 06/20/2023 0901   HCT 41.4 06/20/2023 0901   PLT 202 06/20/2023 0901   MCV 86 06/20/2023 0901   MCH 28.5 06/20/2023 0901   MCH 28.5 11/26/2022 0648   MCHC 33.1 06/20/2023 0901   MCHC 34.3 11/26/2022 0648   RDW 13.4 06/20/2023 0901   Iron Studies No results found for: IRON, TIBC, FERRITIN, IRONPCTSAT Lipid Panel     Component Value Date/Time   CHOL 181 06/20/2023 0901   TRIG 220 (H) 06/20/2023 0901   HDL 37 (L) 06/20/2023 0901   CHOLHDL 4.9 06/20/2023 0901   CHOLHDL 5 06/12/2021 0923   VLDL 34.0 06/12/2021 0923   LDLCALC 106 (H) 06/20/2023 0901   Hepatic Function Panel     Component Value Date/Time   PROT 6.3 06/20/2023 0901   ALBUMIN 4.4 06/20/2023 0901   AST 23 06/20/2023 0901   ALT 31 06/20/2023 0901   ALKPHOS 48 06/20/2023 0901   BILITOT 0.4 06/20/2023 0901   BILIDIR 0.1 11/26/2022 0648   IBILI 0.4 11/26/2022 0648      Component Value Date/Time   TSH 1.870 06/20/2023 0901   Nutritional Lab Results  Component Value Date   VD25OH 27.4 (L) 06/20/2023    Attestations:   LILLETTE Vernell Forest, acting as a medical scribe for Brian Jenkins, DO., have compiled all relevant documentation for today's office visit on behalf of Brian Jenkins, DO, while in the presence of [employer], DO.  Reviewed by  clinician on day of visit: allergies, medications, problem list, medical history, surgical history, family history, social history, and previous encounter notes pertinent to patient's obesity diagnosis.  I have reviewed the above documentation for accuracy and completeness, and I agree with the above. Brian JINNY Lam, D.O.  The 21st Century Cures Act was signed into law in 2016 which includes the topic of electronic health records.  This provides immediate access to information in MyChart.  This includes consultation notes,  operative notes, office notes, lab results and pathology reports.  If you have any questions about what you read please let us  know at your next visit so we can discuss your concerns and take corrective action if need be.  We are right here with you.

## 2023-09-22 ENCOUNTER — Ambulatory Visit (INDEPENDENT_AMBULATORY_CARE_PROVIDER_SITE_OTHER): Admitting: Family Medicine

## 2023-10-20 ENCOUNTER — Encounter (INDEPENDENT_AMBULATORY_CARE_PROVIDER_SITE_OTHER): Payer: Self-pay | Admitting: Family Medicine

## 2023-10-20 ENCOUNTER — Ambulatory Visit (INDEPENDENT_AMBULATORY_CARE_PROVIDER_SITE_OTHER): Admitting: Family Medicine

## 2023-10-20 VITALS — BP 120/77 | HR 61 | Temp 98.1°F | Ht 72.0 in | Wt 289.0 lb

## 2023-10-20 DIAGNOSIS — G43109 Migraine with aura, not intractable, without status migrainosus: Secondary | ICD-10-CM

## 2023-10-20 DIAGNOSIS — E559 Vitamin D deficiency, unspecified: Secondary | ICD-10-CM

## 2023-10-20 DIAGNOSIS — E538 Deficiency of other specified B group vitamins: Secondary | ICD-10-CM | POA: Diagnosis not present

## 2023-10-20 DIAGNOSIS — Z6839 Body mass index (BMI) 39.0-39.9, adult: Secondary | ICD-10-CM

## 2023-10-20 MED ORDER — TOPIRAMATE 25 MG PO TABS
ORAL_TABLET | ORAL | 1 refills | Status: DC
Start: 2023-10-20 — End: 2023-11-21

## 2023-10-20 MED ORDER — VITAMIN D (ERGOCALCIFEROL) 1.25 MG (50000 UNIT) PO CAPS: 50000.0000 [IU] | ORAL_CAPSULE | ORAL | 1 refills | Status: DC

## 2023-10-20 NOTE — Progress Notes (Signed)
 Brian Lam, D.O.  ABFM, ABOM Specializing in Clinical Bariatric Medicine  Office located at: [address] [place], [place]  [address]   Assessment and Plan:   Medications Discontinued During This Encounter  Medication Reason   Vitamin D , Ergocalciferol , (DRISDOL ) 1.25 MG (50000 UNIT) CAPS capsule Reorder   topiramate  (TOPAMAX ) 25 MG tablet Reorder    Meds ordered this encounter  Medications   topiramate  (TOPAMAX ) 25 MG tablet    Sig: 1 po bid    Dispense:  60 tablet    Refill:  1   Vitamin D , Ergocalciferol , (DRISDOL ) 1.25 MG (50000 UNIT) CAPS capsule    Sig: Take 1 capsule (50,000 Units total) by mouth every 7 (seven) days.    Dispense:  4 capsule    Refill:  1     Come fasting to next OV for labs.   FOR THE DISEASE OF OBESITY:  Morbid obesity -- starting bmi 44.88 BMI 39.0-39.9,adult - Current BMI 39.19 Assessment & Plan: Since last office visit on 08/25/23 patient's muscle mass has decreased by 6lbs. Fat mass has decreased by 7.2 lbs. Total body water  has decreased by 3.2 lbs.  Body fat % has decreased by 0.9%. Counseling done on how various foods will affect these numbers and how to maximize success  Total lbs lost to date: 42 lbs Total weight loss percentage to date: -12.69 %   Recommended Dietary Goals Ory is currently in the action stage of change. As such, his goal is to continue weight management plan.  He has agreed to: Continue CAT 4 MP w L options & start journaling parameters of 1900-2000 calories & 120+++ g of protein.   Behavioral Intervention We discussed the following today: increasing lean protein intake to established goals, decreasing simple carbohydrates , continue to work on maintaining a reduced calorie state, getting the recommended amount of protein, incorporating whole foods, making healthy choices, staying well hydrated and practicing mindfulness when eating., and focusing on food with a 10:1 ratio of calories: grams of  protein  Additional resources provided today: None  Evidence-based interventions for health behavior change were utilized today including the discussion of self monitoring techniques, problem-solving barriers and SMART goal setting techniques.   Regarding patient's less desirable eating habits and patterns, we employed the technique of small changes.   Pt will specifically work on: Increase protein intake at lunch by increasing portion of cottage cheese or yogurt to a full cup OR adding extra lean protein.   Recommended Physical Activity Goals Joeangel has been advised to work up to 300-450 minutes of moderate intensity aerobic activity a week and strengthening exercises 2-3 times per week for cardiovascular health, weight loss maintenance and preservation of muscle mass.   He has agreed to: Continue current level of physical activity    Pharmacotherapy We both agreed to: Continue with current nutritional and behavioral strategies and continue Topamax  (for migraines and wt loss).    ASSOCIATED CONDITIONS ADDRESSED TODAY:  Migraine with aura and without status migrainosus, not intractable Assessment & Plan: Well controlled on Topamax  25 mg BID. Good compliance and tolerance. Hunger/cravings are well controlled. No acute concerns.  -Properly hydrate by drinking 1/2 his body weight in ounces of water  per day.  -Continue Topamax  at current dose. Will refill Topamax  today, no changes.  -Will continue monitoring condition alongside PCP.     Vitamin D  deficiency Assessment & Plan: Lab Results  Component Value Date   VD25OH 27.4 (L) 06/20/2023   Currently  on ERGO 50K units once weekly. Good compliance/tolerance. Vit D was below goal when last checked.  - Ideal vit D range of 50-70 reviewed with patient.  -Continue current supplementation as prescribed. Will refill today, no dose changes.  -Will recheck vit D at his next OV.     B12 deficiency Assessment & Plan: Lab Results   Component Value Date   VITAMINB12 186 (L) 06/20/2023   Compliant with OTC B complex [phone] mcg once daily. Tolerating well with no adverse SE. Last vit B12 was well below goal.   - Ideal vit B12 of 500 or more reviewed with patient.  - Continue current supplementation at current dose.  - Will recheck vit B12 at next OV.     Follow up:   Return in about 4 weeks (around 11/17/2023) for Come fasting for labs on 11/21/23 at 7:20 AM. He was informed of the importance of frequent follow up visits to maximize his success with intensive lifestyle modifications for his multiple health conditions.  Subjective:   Chief complaint: Obesity Sladen is here to discuss his progress with his obesity treatment plan. He is on the Category 4 Plan and states he is following his eating plan approximately 95% of the time. He states he is walking 45-60 minutes 3 days per week.   Interval History:  Jolon Degante is here for a follow up office visit. Since last OV on 08/25/23, he is down 13 lbs. Hunger/cravings better controlled when eating on plan. He has been journaling on [employer]! and reports an average 1900-2000 calories/day and 110 g of protein/day. For breakfast, he tends to eat 3 eggs and 2 [place] sausages. Lunch is typically a [place] breast sandwich w 4 ounces, a half cup of cottage cheese, and 1/2 cup of berries.    Pharmacotherapy that aid with weight loss: He is currently taking Topamax  25 mg BID.    Review of Systems:  Pertinent positives were addressed with patient today.  Reviewed by clinician on day of visit: allergies, medications, problem list, medical history, surgical history, family history, social history, and previous encounter notes.  Weight Summary and Biometrics   Weight Lost Since Last Visit: 13lb  Weight Gained Since Last Visit: 0   Vitals Temp: 98.1 F (36.7 C) BP: 120/77 Pulse Rate: 61 SpO2: 96 %   Anthropometric Measurements Height: 6' (1.829 m) Weight: 289 lb  (131.1 kg) BMI (Calculated): 39.19 Weight at Last Visit: 302lb Weight Lost Since Last Visit: 13lb Weight Gained Since Last Visit: 0 Starting Weight: 331lb Total Weight Loss (lbs): 42 lb (19.1 kg) Peak Weight: 335lb   Body Composition  Body Fat %: 33.3 % Fat Mass (lbs): 96.4 lbs Muscle Mass (lbs): 183.6 lbs Total Body Water  (lbs): 141.4 lbs Visceral Fat Rating : 18   Other Clinical Data Fasting: no Labs: no Today's Visit #: 5 Starting Date: 06/20/23    Objective:   PHYSICAL EXAM: Blood pressure 120/77, pulse 61, temperature 98.1 F (36.7 C), height 6' (1.829 m), weight 289 lb (131.1 kg), SpO2 96%. Body mass index is 39.2 kg/m.  General: he is overweight, cooperative and in no acute distress. PSYCH: Has normal mood, affect and thought process.   HEENT: EOMI, sclerae are anicteric. Lungs: Normal breathing effort, no conversational dyspnea. Extremities: Moves * 4 Neurologic: A and O * 3, good insight  DIAGNOSTIC DATA REVIEWED: BMET    Component Value Date/Time   NA 140 06/20/2023 0901   K 4.4 06/20/2023 0901   CL 104 06/20/2023 0901  CO2 23 06/20/2023 0901   GLUCOSE 93 06/20/2023 0901   GLUCOSE 91 01/10/2023 1230   BUN 15 06/20/2023 0901   CREATININE 1.29 (H) 06/20/2023 0901   CALCIUM 8.9 06/20/2023 0901   GFRNONAA >60 01/10/2023 1230   Lab Results  Component Value Date   HGBA1C 5.8 (H) 06/20/2023   HGBA1C 5.9 06/12/2021   Lab Results  Component Value Date   INSULIN  35.5 (H) 06/20/2023   Lab Results  Component Value Date   TSH 1.870 06/20/2023   CBC    Component Value Date/Time   WBC 6.1 06/20/2023 0901   WBC 8.4 11/26/2022 0648   RBC 4.80 06/20/2023 0901   RBC 4.77 11/26/2022 0648   HGB 13.7 06/20/2023 0901   HCT 41.4 06/20/2023 0901   PLT 202 06/20/2023 0901   MCV 86 06/20/2023 0901   MCH 28.5 06/20/2023 0901   MCH 28.5 11/26/2022 0648   MCHC 33.1 06/20/2023 0901   MCHC 34.3 11/26/2022 0648   RDW 13.4 06/20/2023 0901   Iron  Studies No results found for: IRON, TIBC, FERRITIN, IRONPCTSAT Lipid Panel     Component Value Date/Time   CHOL 181 06/20/2023 0901   TRIG 220 (H) 06/20/2023 0901   HDL 37 (L) 06/20/2023 0901   CHOLHDL 4.9 06/20/2023 0901   CHOLHDL 5 06/12/2021 0923   VLDL 34.0 06/12/2021 0923   LDLCALC 106 (H) 06/20/2023 0901   Hepatic Function Panel     Component Value Date/Time   PROT 6.3 06/20/2023 0901   ALBUMIN 4.4 06/20/2023 0901   AST 23 06/20/2023 0901   ALT 31 06/20/2023 0901   ALKPHOS 48 06/20/2023 0901   BILITOT 0.4 06/20/2023 0901   BILIDIR 0.1 11/26/2022 0648   IBILI 0.4 11/26/2022 0648      Component Value Date/Time   TSH 1.870 06/20/2023 0901   Nutritional Lab Results  Component Value Date   VD25OH 27.4 (L) 06/20/2023    Attestations:   LILLETTE Vernell Forest, acting as a medical scribe for Brian Jenkins, DO., have compiled all relevant documentation for today's office visit on behalf of Brian Jenkins, DO, while in the presence of [employer], DO.  I have reviewed the above documentation for accuracy and completeness, and I agree with the above. Brian JINNY Lam, D.O.  The 21st Century Cures Act was signed into law in 2016 which includes the topic of electronic health records.  This provides immediate access to information in MyChart.  This includes consultation notes, operative notes, office notes, lab results and pathology reports.  If you have any questions about what you read please let us  know at your next visit so we can discuss your concerns and take corrective action if need be.  We are right here with you.

## 2023-11-21 ENCOUNTER — Ambulatory Visit (INDEPENDENT_AMBULATORY_CARE_PROVIDER_SITE_OTHER): Admitting: Family Medicine

## 2023-11-21 ENCOUNTER — Encounter (INDEPENDENT_AMBULATORY_CARE_PROVIDER_SITE_OTHER): Payer: Self-pay | Admitting: Family Medicine

## 2023-11-21 VITALS — BP 99/64 | HR 69 | Temp 98.5°F | Ht 72.0 in | Wt 282.0 lb

## 2023-11-21 DIAGNOSIS — E782 Mixed hyperlipidemia: Secondary | ICD-10-CM

## 2023-11-21 DIAGNOSIS — Z6841 Body Mass Index (BMI) 40.0 and over, adult: Secondary | ICD-10-CM

## 2023-11-21 DIAGNOSIS — G43109 Migraine with aura, not intractable, without status migrainosus: Secondary | ICD-10-CM

## 2023-11-21 DIAGNOSIS — R7303 Prediabetes: Secondary | ICD-10-CM | POA: Diagnosis not present

## 2023-11-21 DIAGNOSIS — E538 Deficiency of other specified B group vitamins: Secondary | ICD-10-CM

## 2023-11-21 DIAGNOSIS — Z6838 Body mass index (BMI) 38.0-38.9, adult: Secondary | ICD-10-CM

## 2023-11-21 DIAGNOSIS — E65 Localized adiposity: Secondary | ICD-10-CM

## 2023-11-21 DIAGNOSIS — E559 Vitamin D deficiency, unspecified: Secondary | ICD-10-CM | POA: Diagnosis not present

## 2023-11-21 MED ORDER — TOPIRAMATE 50 MG PO TABS: ORAL_TABLET | ORAL | 1 refills | Status: DC

## 2023-11-21 MED ORDER — VITAMIN D (ERGOCALCIFEROL) 1.25 MG (50000 UNIT) PO CAPS: 50000.0000 [IU] | ORAL_CAPSULE | ORAL | 1 refills | Status: DC

## 2023-11-21 NOTE — Progress Notes (Signed)
 Brian Lam, D.O.  ABFM, ABOM Specializing in Clinical Bariatric Medicine  Office located at: [address] [place], [place]  [address]    Assessment and Plan:   Orders Placed This Encounter  Procedures   Comprehensive metabolic panel with GFR   Hemoglobin A1c   Lipid panel   Insulin , random   VITAMIN D  25 Hydroxy (Vit-D Deficiency, Fractures)   Vitamin B12   Magnesium     Medications Discontinued During This Encounter  Medication Reason   topiramate  (TOPAMAX ) 25 MG tablet Reorder   Vitamin D , Ergocalciferol , (DRISDOL ) 1.25 MG (50000 UNIT) CAPS capsule Reorder     Meds ordered this encounter  Medications   topiramate  (TOPAMAX ) 50 MG tablet    Sig: 1 po bid    Dispense:  60 tablet    Refill:  1   Vitamin D , Ergocalciferol , (DRISDOL ) 1.25 MG (50000 UNIT) CAPS capsule    Sig: Take 1 capsule (50,000 Units total) by mouth every 7 (seven) days.    Dispense:  4 capsule    Refill:  1      FOR THE DISEASE OF OBESITY:  BMI 40.0-44.9, adult (HCC) - Current BMI 38.25 Morbid obesity -- starting bmi 44.88 Assessment & Plan: Since last office visit on 10/20/2023 patient's muscle mass has decreased by 2.2 lbs. Fat mass has decreased by 5 lbs. Total body water  has decreased by 6 lbs.  Body fat % has decreased by 0.9  %. Counseling done on how various foods will affect these numbers and how to maximize success  Total lbs lost to date: -49 lbs Total weight loss percentage to date: -14.80 %   Recommended Dietary Goals Brian Lam is currently in the action stage of change. As such, his goal is to continue weight management plan.  He has agreed to: continue current plan   Behavioral Intervention We discussed the following today: increasing lean protein intake to established goals, decreasing simple carbohydrates , increasing water  intake , and work on managing stress, creating time for self-care and relaxation (e.g deep breathing), decreasing consumption of processed foods.    Additional resources provided today: Handout on Common Characteristics of Successful Weight Losers and Maintainers   Evidence-based interventions for health behavior change were utilized today including the discussion of self monitoring techniques, problem-solving barriers and SMART goal setting techniques.   Regarding patient's less desirable eating habits and patterns, we employed the technique of small changes.   Goal(s) for next OV: try 4-7-8 breathing for work stress management   Recommended Physical Activity Goals Brian Lam has been advised to work up to 300-450 minutes of moderate intensity aerobic activity a week and strengthening exercises 2-3 times per week for cardiovascular health, weight loss maintenance and preservation of muscle mass.   He has agreed to continue his walking regimen and was encouraged to ADD strengthening exercises with a goal of 2-3 sessions a week    Pharmacotherapy He was on Ozempic  back in 2023. He experienced a weight loss of 10-15 lbs on the medicine, but after going off it, he later regained the weight. He did not have side effects on the Ozempic . Discussed the possibility of initiating Mounjaro  in the future.   ASSOCIATED CONDITIONS ADDRESSED TODAY:   Migraine with aura and without status migrainosus, not intractable Assessment & Plan: He is prescribed Topiramate  25 mg BID. He ran out of his Topiramate  4-5 days ago and has noticed an increase in mild headaches. When he was taking the Topiramate , he felt his headaches  were much less frequent. Of note, he still experiences cravings for carbohydrates. Shared decision making: Uptitrate Topiramate  to 50 mg BID as tolerated. Reviewed anticipated benefits/potential risks. Will cont to monitor expectantly.     Pre-diabetes Assessment & Plan: Lab Results  Component Value Date   HGBA1C 5.8 (H) 06/20/2023   HGBA1C 5.9 06/12/2021   INSULIN  35.5 (H) 06/20/2023    Pre-DM managed with dietary and life style  interventions. He denies excessive hunger, but does experience cravings (see Migraine note above). Continue working on nutrition plan/journaling to decrease simple carbohydrates, increase lean proteins and exercise to promote weight loss and improve glycemic control and prevent progression to T2DM. Recheck labs today.     Mixed hyperlipidemia Assessment & Plan: Lab Results  Component Value Date   CHOL 181 06/20/2023   HDL 37 (L) 06/20/2023   LDLCALC 106 (H) 06/20/2023   TRIG 220 (H) 06/20/2023   CHOLHDL 4.9 06/20/2023   HLD managed with dietary and lifestyle interventions. Recheck lipid panel today. Continue to work on nutrition plan -decreasing simple carbohydrates, increasing lean proteins, decreasing saturated fats and cholesterol , avoiding trans fats and exercise as able to promote weight loss, improve lipids and decrease cardiovascular risks.     Visceral obesity Assessment & Plan: Starting visceral fat rating on 05/17/2023: 24. Current visceral fat rating: 17. The visceral fat rating should be < 10 in a male.  Visceral adipose tissue is a hormonally active component of total body fat. This body composition phenotype is associated with medical disorders such as metabolic syndrome, cardiovascular disease and several malignancies including prostate, breast, and colorectal cancers. Goal: Lose 7-10% of weight via prudent nutritional plan and lifestyle changes.     Vitamin D  deficiency Assessment & Plan: Lab Results  Component Value Date   VD25OH 27.4 (L) 06/20/2023   Currently on prescription strength Vit D with good compliance and tolerance. No acute concerns. Continue regimen and weight loss efforts. Recheck levels today.    B12 deficiency Assessment & Plan: Lab Results  Component Value Date   VITAMINB12 186 (L) 06/20/2023   On a daily B complex vitamin with reported good compliance and tolerance. No acute concerns. Maintain with B12 supplementation; Continue nutrient rich  diet Recheck levels today.    Follow up:   Return 12/29/2023 3:40 PM.  He was informed of the importance of frequent follow up visits to maximize his success with intensive lifestyle modifications for his multiple health conditions.  Brian Lam is aware that we will review all of his lab results at our next visit together in person.  He is aware that if anything is critical/ life threatening with the results, we will be contacting him via MyChart or by my CMA will be calling them prior to the office visit to discuss acute management.     Subjective:    Chief complaint: Obesity Brian Lam is here to discuss his progress with his obesity treatment plan. He is on the Category 4 Plan and keeping a food journal and adhering to recommended goals of 1900-2000 calories and 120++ grams protein and states he is following his eating plan approximately 85-90% of the time. Pt is walking 60 minutes 3 days per week    Interval History:  Brian Lam is here for a follow up office visit. Pt has experienced a weight loss  of 7 lbs  since last OV on 10/20/2023. He has been doing well and desires to maintain his weight for the next couple of months. Food wise,  he is journaling his intake and averages 2000 calories and anywhere between 120-160 grams of protein daily. He is drinking ~ 100 ounces of water  daily (previously was drinking 140 ounces daily). He is on CPAP and is sleeping 7-9 hours/nite. He wakes up feeling refreshed.   Pharmacotherapy that aid with weight loss: He is currently taking Topiramate  25 mg BID.    Review of Systems:  Pertinent positives were addressed with patient today.  Reviewed by clinician on day of visit: allergies, medications, problem list, medical history, surgical history, family history, social history, and previous encounter notes.  Weight Summary and Biometrics   Weight Lost Since Last Visit: 7lb  Weight Gained Since Last Visit: 0   Vitals Temp: 98.5 F (36.9  C) BP: 99/64 Pulse Rate: 69 SpO2: 96 %   Anthropometric Measurements Height: 6' (1.829 m) Weight: 282 lb (127.9 kg) BMI (Calculated): 38.24 Weight at Last Visit: 289lb Weight Lost Since Last Visit: 7lb Weight Gained Since Last Visit: 0 Starting Weight: 331lb Total Weight Loss (lbs): 49 lb (22.2 kg) Peak Weight: 335lb   Body Composition  Body Fat %: 32.4 % Fat Mass (lbs): 91.4 lbs Muscle Mass (lbs): 181.4 lbs Total Body Water  (lbs): 135.4 lbs Visceral Fat Rating : 17   Other Clinical Data Fasting: yes Labs: yes Today's Visit #: 6 Starting Date: 06/20/23    Objective:   PHYSICAL EXAM: Blood pressure 99/64, pulse 69, temperature 98.5 F (36.9 C), height 6' (1.829 m), weight 282 lb (127.9 kg), SpO2 96%. Body mass index is 38.25 kg/m.  General: he is overweight, cooperative and in no acute distress. PSYCH: Has normal mood, affect and thought process.   HEENT: EOMI, sclerae are anicteric. Lungs: Normal breathing effort, no conversational dyspnea. Extremities: Moves * 4 Neurologic: A and O * 3, good insight  DIAGNOSTIC DATA REVIEWED: BMET    Component Value Date/Time   NA 140 06/20/2023 0901   K 4.4 06/20/2023 0901   CL 104 06/20/2023 0901   CO2 23 06/20/2023 0901   GLUCOSE 93 06/20/2023 0901   GLUCOSE 91 01/10/2023 1230   BUN 15 06/20/2023 0901   CREATININE 1.29 (H) 06/20/2023 0901   CALCIUM 8.9 06/20/2023 0901   GFRNONAA >60 01/10/2023 1230   Lab Results  Component Value Date   HGBA1C 5.8 (H) 06/20/2023   HGBA1C 5.9 06/12/2021   Lab Results  Component Value Date   INSULIN  35.5 (H) 06/20/2023   Lab Results  Component Value Date   TSH 1.870 06/20/2023   CBC    Component Value Date/Time   WBC 6.1 06/20/2023 0901   WBC 8.4 11/26/2022 0648   RBC 4.80 06/20/2023 0901   RBC 4.77 11/26/2022 0648   HGB 13.7 06/20/2023 0901   HCT 41.4 06/20/2023 0901   PLT 202 06/20/2023 0901   MCV 86 06/20/2023 0901   MCH 28.5 06/20/2023 0901   MCH 28.5  11/26/2022 0648   MCHC 33.1 06/20/2023 0901   MCHC 34.3 11/26/2022 0648   RDW 13.4 06/20/2023 0901   Iron Studies No results found for: IRON, TIBC, FERRITIN, IRONPCTSAT Lipid Panel     Component Value Date/Time   CHOL 181 06/20/2023 0901   TRIG 220 (H) 06/20/2023 0901   HDL 37 (L) 06/20/2023 0901   CHOLHDL 4.9 06/20/2023 0901   CHOLHDL 5 06/12/2021 0923   VLDL 34.0 06/12/2021 0923   LDLCALC 106 (H) 06/20/2023 0901   Hepatic Function Panel     Component Value Date/Time   PROT 6.3 06/20/2023  0901   ALBUMIN 4.4 06/20/2023 0901   AST 23 06/20/2023 0901   ALT 31 06/20/2023 0901   ALKPHOS 48 06/20/2023 0901   BILITOT 0.4 06/20/2023 0901   BILIDIR 0.1 11/26/2022 0648   IBILI 0.4 11/26/2022 0648      Component Value Date/Time   TSH 1.870 06/20/2023 0901   Nutritional Lab Results  Component Value Date   VD25OH 27.4 (L) 06/20/2023    Attestations:   I, Special Puri, acting as a Stage manager for Brian Jenkins, DO., have compiled all relevant documentation for today's office visit on behalf of Brian Jenkins, DO, while in the presence of [employer], DO.  I have reviewed the above documentation for accuracy and completeness, and I agree with the above. Brian JINNY Lam, D.O.  The 21st Century Cures Act was signed into law in 2016 which includes the topic of electronic health records.  This provides immediate access to information in MyChart.  This includes consultation notes, operative notes, office notes, lab results and pathology reports.  If you have any questions about what you read please let us  know at your next visit so we can discuss your concerns and take corrective action if need be.  We are right here with you.

## 2023-11-22 ENCOUNTER — Other Ambulatory Visit (INDEPENDENT_AMBULATORY_CARE_PROVIDER_SITE_OTHER): Payer: Self-pay | Admitting: Family Medicine

## 2023-11-22 ENCOUNTER — Ambulatory Visit (INDEPENDENT_AMBULATORY_CARE_PROVIDER_SITE_OTHER): Payer: Self-pay | Admitting: Family Medicine

## 2023-11-22 DIAGNOSIS — E538 Deficiency of other specified B group vitamins: Secondary | ICD-10-CM

## 2023-11-22 LAB — LIPID PANEL
Chol/HDL Ratio: 3.8 ratio (ref 0.0–5.0)
Cholesterol, Total: 141 mg/dL (ref 100–199)
HDL: 37 mg/dL — ABNORMAL LOW (ref >39)
LDL Chol Calc (NIH): 85 mg/dL (ref 0–99)
Triglycerides: 100 mg/dL (ref 0–149)
VLDL Cholesterol Cal: 19 mg/dL (ref 5–40)

## 2023-11-22 LAB — COMPREHENSIVE METABOLIC PANEL WITH GFR
ALT: 18 IU/L (ref 0–44)
AST: 17 IU/L (ref 0–40)
Albumin: 4.6 g/dL (ref 4.1–5.1)
Alkaline Phosphatase: 56 IU/L (ref 44–121)
BUN/Creatinine Ratio: 13 (ref 9–20)
BUN: 14 mg/dL (ref 6–24)
Bilirubin Total: 0.7 mg/dL (ref 0.0–1.2)
CO2: 20 mmol/L (ref 20–29)
Calcium: 9.1 mg/dL (ref 8.7–10.2)
Chloride: 106 mmol/L (ref 96–106)
Creatinine, Ser: 1.12 mg/dL (ref 0.76–1.27)
Globulin, Total: 2.1 g/dL (ref 1.5–4.5)
Glucose: 100 mg/dL — ABNORMAL HIGH (ref 70–99)
Potassium: 4.4 mmol/L (ref 3.5–5.2)
Sodium: 142 mmol/L (ref 134–144)
Total Protein: 6.7 g/dL (ref 6.0–8.5)
eGFR: 85 mL/min/1.73 (ref >59)

## 2023-11-22 LAB — HEMOGLOBIN A1C
Est. average glucose Bld gHb Est-mCnc: 108 mg/dL
Hgb A1c MFr Bld: 5.4 % (ref 4.8–5.6)

## 2023-11-22 LAB — INSULIN, RANDOM: INSULIN: 11.6 u[IU]/mL (ref 2.6–24.9)

## 2023-11-22 LAB — VITAMIN B12: Vitamin B-12: 237 pg/mL (ref 232–1245)

## 2023-11-22 LAB — VITAMIN D 25 HYDROXY (VIT D DEFICIENCY, FRACTURES): Vit D, 25-Hydroxy: 62.6 ng/mL (ref 30.0–100.0)

## 2023-11-22 LAB — MAGNESIUM: Magnesium: 2.1 mg/dL (ref 1.6–2.3)

## 2023-11-22 MED ORDER — CYANOCOBALAMIN 500 MCG PO TABS: ORAL_TABLET | ORAL | Status: AC

## 2023-12-09 ENCOUNTER — Encounter: Payer: Self-pay | Admitting: Family Medicine

## 2023-12-13 ENCOUNTER — Telehealth (INDEPENDENT_AMBULATORY_CARE_PROVIDER_SITE_OTHER): Admitting: Family Medicine

## 2023-12-13 ENCOUNTER — Encounter: Payer: Self-pay | Admitting: Family Medicine

## 2023-12-13 DIAGNOSIS — F339 Major depressive disorder, recurrent, unspecified: Secondary | ICD-10-CM | POA: Diagnosis not present

## 2023-12-13 MED ORDER — BUPROPION HCL ER (XL) 150 MG PO TB24: 150.0000 mg | ORAL_TABLET | Freq: Every day | ORAL | 3 refills | Status: DC

## 2023-12-13 NOTE — Progress Notes (Signed)
 Patient ID: Brian Lam, male   DOB: Oct 24, 1985, 38 y.o.   MRN: [id number]  Virtual Visit via Video Note  I connected with Brian Lam on 12/13/23 at  3:30 PM EDT by a video enabled telemedicine application and verified that I am speaking with the correct person using two identifiers.  Location patient: home Location provider:work or home office Persons participating in the virtual visit: patient, provider  I discussed the limitations of evaluation and management by telemedicine and the availability of in person appointments. The patient expressed understanding and agreed to proceed.   HPI:  Brian has history of migraine headaches, obstructive sleep apnea, GERD, recurrent depression.  Set up this visit to discuss depression concerns.  He was placed on Prozac  40 mg daily months ago and initially did very well.  He feels like he has had some issues with breakthrough recurrent depression symptoms at this point on Prozac  40.  He is getting counseling therapy regularly every 3 to 4 weeks and feels like that helps somewhat.  No active suicidal ideation.  He does folic his depression symptoms when they flare are impacting his home life and work.  Does not recall any other prior antidepressant medications other than fluoxetine .   ROS: See pertinent positives and negatives per HPI.  Past Medical History:  Diagnosis Date   Back pain    Chest pain    Chronic knee pain    Contact dermatitis    Depression    GERD (gastroesophageal reflux disease)    History of kidney stones    History of stomach ulcers    IBS (irritable bowel syndrome)    Migraines    OSA on CPAP    severe obstructive sleep apnea with an AHI 57.7/h and nocturnal hypoxemia for O2 saturations less than 88% for 22.6 minutes.   Pneumonia    Pre-diabetes    Shortness of breath     Past Surgical History:  Procedure Laterality Date   CYSTOSCOPY/URETEROSCOPY/HOLMIUM LASER/STENT PLACEMENT Right 01/10/2023   Procedure: CYSTOSCOPY,  RIGHT RETROGRADE PYELOGRAM, RIGHT URETEROSCOPY, HOLMIUM LASER LITHOTRIPSY, AND RIGHT URETERAL STENT PLACEMENT;  Surgeon: Selma Donnice SAUNDERS, MD;  Location: [facility];  Service: Urology;  Laterality: Right;  60 MINUTES   VASECTOMY  2020    Family History  Problem Relation Age of Onset   Obesity Mother    Sleep apnea Mother    Anxiety disorder Mother    Depression Mother    Thyroid  disease Mother    Obesity Father    Kidney disease Father    Hyperlipidemia Father    Diabetes Father    Diabetes type II Father    Sleep apnea Father    Heart attack Maternal Grandfather    Diabetes Maternal Grandfather    Colon cancer Neg Hx    Esophageal cancer Neg Hx    Stomach cancer Neg Hx    Rectal cancer Neg Hx     SOCIAL HX: Non-smoker.   Current Outpatient Medications:    b complex vitamins capsule, Take 1 capsule by mouth daily. 500 mcg-1000 mcg B12 daily, Disp: , Rfl:    buPROPion (WELLBUTRIN XL) 150 MG 24 hr tablet, Take 1 tablet (150 mg total) by mouth daily., Disp: 30 tablet, Rfl: 3   cyanocobalamin  (VITAMIN B12) 500 MCG tablet, 500- 1000 mcg daily, Disp: , Rfl:    FLUoxetine  (PROZAC ) 40 MG capsule, Take 1 capsule (40 mg total) by mouth daily., Disp: 90 capsule, Rfl: 3   omeprazole  (PRILOSEC) 20 MG capsule, Take  1 tablet by mouth twice daily, Disp: 180 capsule, Rfl: 0   Rimegepant Sulfate (NURTEC) 75 MG TBDP, Take 1 tablet by mouth at onset of migraine and may repeat 1 in 2 hours as needed but no more than 2 in 24 hours, Disp: 8 tablet, Rfl: 5  EXAM:  VITALS per patient if applicable:  GENERAL: alert, oriented, appears well and in no acute distress  HEENT: atraumatic, conjunttiva clear, no obvious abnormalities on inspection of external nose and ears  NECK: normal movements of the head and neck  LUNGS: on inspection no signs of respiratory distress, breathing rate appears normal, no obvious gross SOB, gasping or wheezing  CV: no obvious cyanosis  MS: moves all visible extremities  without noticeable abnormality  PSYCH/NEURO: pleasant and cooperative, no obvious depression or anxiety, speech and thought processing grossly intact  ASSESSMENT AND PLAN:  Discussed the following assessment and plan:  Recurrent depression.  Patient currently on Prozac  40 mg daily.  We discussed possible addition of Wellbutrin XL 150 mg daily and patient agrees.  Continue regular counseling.  Give feedback in 3 to 4 weeks.  We have advised taking this medication in the morning to reduce insomnia issues.  If not improved at 3 to 4 weeks consider further titration of Wellbutrin versus changing Prozac  to another medication     I discussed the assessment and treatment plan with the patient. The patient was provided an opportunity to ask questions and all were answered. The patient agreed with the plan and demonstrated an understanding of the instructions.   The patient was advised to call back or seek an in-person evaluation if the symptoms worsen or if the condition fails to improve as anticipated.     Wolm Scarlet, MD

## 2023-12-29 ENCOUNTER — Encounter (INDEPENDENT_AMBULATORY_CARE_PROVIDER_SITE_OTHER): Payer: Self-pay | Admitting: Family Medicine

## 2023-12-29 ENCOUNTER — Ambulatory Visit (INDEPENDENT_AMBULATORY_CARE_PROVIDER_SITE_OTHER): Admitting: Family Medicine

## 2023-12-29 DIAGNOSIS — E538 Deficiency of other specified B group vitamins: Secondary | ICD-10-CM | POA: Diagnosis not present

## 2023-12-29 DIAGNOSIS — F339 Major depressive disorder, recurrent, unspecified: Secondary | ICD-10-CM

## 2023-12-29 DIAGNOSIS — E782 Mixed hyperlipidemia: Secondary | ICD-10-CM | POA: Diagnosis not present

## 2023-12-29 DIAGNOSIS — Z87442 Personal history of urinary calculi: Secondary | ICD-10-CM

## 2023-12-29 DIAGNOSIS — Z630 Problems in relationship with spouse or partner: Secondary | ICD-10-CM | POA: Diagnosis not present

## 2023-12-29 DIAGNOSIS — R7303 Prediabetes: Secondary | ICD-10-CM

## 2023-12-29 DIAGNOSIS — Z6841 Body Mass Index (BMI) 40.0 and over, adult: Secondary | ICD-10-CM

## 2023-12-29 DIAGNOSIS — Z6837 Body mass index (BMI) 37.0-37.9, adult: Secondary | ICD-10-CM

## 2023-12-29 NOTE — Progress Notes (Signed)
 Brian Lam, D.O.  ABFM, ABOM Specializing in Clinical Bariatric Medicine  Office located at: [address] [place], [place]  [address]      A) FOR THE CHRONIC DISEASE OF OBESITY:  Chief complaint: Obesity Brian Lam is here to discuss his progress with his obesity treatment plan.   History of present illness / Interval history:  Brian Lam is here today for his follow-up office visit.  Since last OV on 11/21/23, pt is down 2 lbs. Pt states that it has been a rocky month. He has been working through a [employer]. He recently went to [place]  for work and states that he had a good time.      11/21/23 07:00 12/29/23 15:00   Body Fat % 32.4 % 32.6 %  Muscle Mass (lbs) 181.4 lbs 179.6 lbs  Fat Mass (lbs) 91.4 lbs 91.4 lbs  Total Body Water  (lbs) 135.4 lbs 140 lbs  Visceral Fat Rating  17 17    Counseling done on how various foods will affect these numbers and how to maximize success   Total lbs lost to date: - 51 lbs Total Fat Mass in lbs lost to date: - 39.2 lbs Total weight loss percentage to date: -15.41 %    Morbid obesity -- starting bmi 44.88 BMI 40.0-44.9, adult (HCC) - Current BMI 37.97  Nutrition Therapy He is journaling 1900-2000 calories with 120++ g protein using Cat 4 MP as guide and states he is following his eating plan approximately 75 % of the time.   - Tracking Calories/Macros: no  - Eating More Whole Foods: yes  - Adequate Protein Intake: yes  - Adequate Water  Intake: yes  - Skipping Meals: no   - Sleeping 7-9 Hours/ Night: yes   Brian Lam is currently in the action stage of change. As such, his goal is to continue weight management plan.  He has agreed to: Focus on eating healthy and a balanced meal   Physical Activity Brian Lam is walking 45  minutes 3 days per week   Brian Lam has been advised to work up to 300-450 minutes of moderate intensity aerobic activity a week and strengthening exercises 2-3 times per week for cardiovascular  health, weight loss maintenance and preservation of muscle mass.  He has agreed to : Think about enjoyable ways to increase daily physical activity and overcoming barriers to exercise, Increase physical activity in their day and reduce sedentary time (increase NEAT)., and Increase volume of physical activity to a goal of 240 minutes a week   Behavioral Modifications Evidence-based interventions for health behavior change were utilized today including the discussion of  1) self monitoring techniques:    2) problem-solving barriers:    - Download calm app  3) self care:    - Practice meditation  - Look into 4-7-8 breathing  4) SMART goals for next OV:    - Make a self care regimen of an hour everyday   Regarding patient's less desirable eating habits and patterns, we employed the technique of small changes.   We discussed the following today: increasing water  intake , work on managing stress, creating time for self-care and relaxation, and continue to work on maintaining a reduced calorie state, getting the recommended amount of protein, incorporating whole foods, making healthy choices, staying well hydrated and practicing mindfulness when eating., extensive discussion on how to eat a balanced meal  Additional resources provided today: Handout on balanced plate concepts.     Medical Interventions/ Pharmacotherapy Previous  Bariatric surgery: n/a Pharmacotherapy for weight loss: He is currently taking Topiramate  50 mg BID for medical weight loss.    We discussed various medication options to help Brian Lam with his weight loss efforts and we both agreed to : Continue with current nutritional and behavioral strategies and Adequate clinical response to anti-obesity medication, continue current regimen   B) OBESITY RELATED CONDITIONS ADDRESSED TODAY:  Pre-diabetes Assessment & Plan Lab Results  Component Value Date   HGBA1C 5.4 11/21/2023   HGBA1C 5.8 (H) 06/20/2023   HGBA1C 5.9  06/12/2021   INSULIN  11.6 11/21/2023   INSULIN  35.5 (H) 06/20/2023   Managed with diet and lifestyle interventions. Pt states that he has good control of hunger and cravings. No acute concerns today. A1c has decreased from 5.8 to 5.4. Pre-DM is well managed. Continue following prudent meal plan and focusing on decreasing simple carbs.     B12 deficiency Assessment & Plan Lab Results  Component Value Date   VITAMINB12 237 11/21/2023   Lab Results  Component Value Date   VD25OH 62.6 11/21/2023   VD25OH 27.4 (L) 06/20/2023   Pt reporting taking OTC B12 and B complex for about a month. Pt is unsure of the dosage at this moment. No concerns today. Continue supplementation and will recheck levels in 3- 4 months.   Pt is taking ERGO 50 K units weekly. Good compliance and tolerance. Pts Vit D levels are at goal. Continue supplementation.      History of nephrolithiasis Assessment & Plan CMP     Component Value Date/Time   NA 142 11/21/2023 0759   K 4.4 11/21/2023 0759   CL 106 11/21/2023 0759   CO2 20 11/21/2023 0759   GLUCOSE 100 (H) 11/21/2023 0759   GLUCOSE 91 01/10/2023 1230   BUN 14 11/21/2023 0759   CREATININE 1.12 11/21/2023 0759   CALCIUM 9.1 11/21/2023 0759   PROT 6.7 11/21/2023 0759   ALBUMIN 4.6 11/21/2023 0759   AST 17 11/21/2023 0759   ALT 18 11/21/2023 0759   ALKPHOS 56 11/21/2023 0759   BILITOT 0.7 11/21/2023 0759   GFR 78.59 09/22/2021 1009   EGFR 85 11/21/2023 0759   GFRNONAA >60 01/10/2023 1230   Pt Creatinine levels are decreasing. CMP is WNL. No acute concerns today. Liver enzymes have also decreased. Explained to pt how numbers will go up if he is stressed and has not hydrated properly. Continue increasing water  intake and avoiding NSAIDs.    Mixed hyperlipidemia Assessment & Plan Lab Results  Component Value Date   CHOL 141 11/21/2023   HDL 37 (L) 11/21/2023   LDLCALC 85 11/21/2023   TRIG 100 11/21/2023   CHOLHDL 3.8 11/21/2023     Cholesterol levels are better. Trig levels have decreased which is due to decreasing the intake of fast food and fried fatty foods. HDL levels will increase as pt increases his exercise. LDL levels have decreased from 106 to 85. No acute concerns today. Continue eating healthy and making healthy choices.     Increased stress work and home Depression, recurrent Assessment & Plan Pt states that his depression has been bad for the past month. He recently saw his PCP anout adjusting his medication and started Wellbutrin XL 150 mg daily in addition to his Prozac  40 mg daily. He states that he has not seen a difference yet with the Wellbutrin since it has only been about 2 weeks. He is going to individual therpy once every 3 weeks and couples therapy once every 3  weeks as well. He wants to increase going to individual therapy every other week. Recommended pt to increase therapy and focus on self care. Explained the benefits of Brian Lam and had an extensive discussion on how to practice self care.      Follow up:   Return 04/02/2024 at 2:00 PM  He was informed of the importance of frequent follow up visits to maximize his success with intensive lifestyle modifications for his multiple health conditions.   Weight Summary and Biometrics   Weight Lost Since Last Visit: 2lb  Weight Gained Since Last Visit: 0lb    Vitals Temp: 98.3 F (36.8 C) BP: 109/70 Pulse Rate: 79 SpO2: 97 %   Anthropometric Measurements Height: 6' (1.829 m) Weight: 280 lb (127 kg) BMI (Calculated): 37.97 Weight at Last Visit: 282lb Weight Lost Since Last Visit: 2lb Weight Gained Since Last Visit: 0lb Starting Weight: 331lb Total Weight Loss (lbs): 51 lb (23.1 kg) Peak Weight: 335lb   Body Composition  Body Fat %: 32.6 % Fat Mass (lbs): 91.4 lbs Muscle Mass (lbs): 179.6 lbs Total Body Water  (lbs): 140 lbs Visceral Fat Rating : 17   Other Clinical Data Fasting: no Labs: no Today's Visit #: 7 Starting  Date: 06/20/23    Objective:   PHYSICAL EXAM: Blood pressure 109/70, pulse 79, temperature 98.3 F (36.8 C), height 6' (1.829 m), weight 280 lb (127 kg), SpO2 97%. Body mass index is 37.97 kg/m.  General: he is overweight, cooperative and in no acute distress. PSYCH: Has normal mood, affect and thought process.   HEENT: EOMI, sclerae are anicteric. Lungs: Normal breathing effort, no conversational dyspnea. Extremities: Moves * 4 Neurologic: A and O * 3, good insight  DIAGNOSTIC DATA REVIEWED: BMET    Component Value Date/Time   NA 142 11/21/2023 0759   K 4.4 11/21/2023 0759   CL 106 11/21/2023 0759   CO2 20 11/21/2023 0759   GLUCOSE 100 (H) 11/21/2023 0759   GLUCOSE 91 01/10/2023 1230   BUN 14 11/21/2023 0759   CREATININE 1.12 11/21/2023 0759   CALCIUM 9.1 11/21/2023 0759   GFRNONAA >60 01/10/2023 1230   Lab Results  Component Value Date   HGBA1C 5.4 11/21/2023   HGBA1C 5.9 06/12/2021   Lab Results  Component Value Date   INSULIN  11.6 11/21/2023   INSULIN  35.5 (H) 06/20/2023   Lab Results  Component Value Date   TSH 1.870 06/20/2023   CBC    Component Value Date/Time   WBC 6.1 06/20/2023 0901   WBC 8.4 11/26/2022 0648   RBC 4.80 06/20/2023 0901   RBC 4.77 11/26/2022 0648   HGB 13.7 06/20/2023 0901   HCT 41.4 06/20/2023 0901   PLT 202 06/20/2023 0901   MCV 86 06/20/2023 0901   MCH 28.5 06/20/2023 0901   MCH 28.5 11/26/2022 0648   MCHC 33.1 06/20/2023 0901   MCHC 34.3 11/26/2022 0648   RDW 13.4 06/20/2023 0901   Iron Studies No results found for: IRON, TIBC, FERRITIN, IRONPCTSAT Lipid Panel     Component Value Date/Time   CHOL 141 11/21/2023 0759   TRIG 100 11/21/2023 0759   HDL 37 (L) 11/21/2023 0759   CHOLHDL 3.8 11/21/2023 0759   CHOLHDL 5 06/12/2021 0923   VLDL 34.0 06/12/2021 0923   LDLCALC 85 11/21/2023 0759   Hepatic Function Panel     Component Value Date/Time   PROT 6.7 11/21/2023 0759   ALBUMIN 4.6 11/21/2023 0759    AST 17 11/21/2023 0759   ALT 18  11/21/2023 0759   ALKPHOS 56 11/21/2023 0759   BILITOT 0.7 11/21/2023 0759   BILIDIR 0.1 11/26/2022 0648   IBILI 0.4 11/26/2022 0648      Component Value Date/Time   TSH 1.870 06/20/2023 0901   Nutritional Lab Results  Component Value Date   VD25OH 62.6 11/21/2023   VD25OH 27.4 (L) 06/20/2023    Attestations:   LILLETTE Sonny Laroche, acting as a medical scribe for Brian Jenkins, DO., have compiled all relevant documentation for today's office visit on behalf of Brian Jenkins, DO, while in the presence of [employer], DO.  I have spent 44 minutes in the care of the patient today including 34 minutes face-to-face assessing and reviewing listed medical problems above as outlined in office visit note and providing nutritional and behavioral counseling as outlined in obesity care plan.   I have reviewed the above documentation for accuracy and completeness, and I agree with the above. Brian JINNY Lam, D.O.  The 21st Century Cures Act was signed into law in 2016 which includes the topic of electronic health records.  This provides immediate access to information in MyChart.  This includes consultation notes, operative notes, office notes, lab results and pathology reports.  If you have any questions about what you read please let us  know at your next visit so we can discuss your concerns and take corrective action if need be.  We are right here with you.

## 2024-01-02 ENCOUNTER — Ambulatory Visit: Payer: Self-pay | Admitting: Cardiology

## 2024-01-26 ENCOUNTER — Ambulatory Visit (INDEPENDENT_AMBULATORY_CARE_PROVIDER_SITE_OTHER): Admitting: Family Medicine

## 2024-02-03 ENCOUNTER — Other Ambulatory Visit (INDEPENDENT_AMBULATORY_CARE_PROVIDER_SITE_OTHER): Payer: Self-pay | Admitting: Family Medicine

## 2024-02-23 ENCOUNTER — Encounter: Payer: Self-pay | Admitting: Family Medicine

## 2024-03-06 ENCOUNTER — Ambulatory Visit: Admitting: Family Medicine

## 2024-03-06 ENCOUNTER — Encounter: Payer: Self-pay | Admitting: Family Medicine

## 2024-03-06 VITALS — BP 92/60 | HR 99 | Temp 98.5°F | Wt 297.7 lb

## 2024-03-06 DIAGNOSIS — M546 Pain in thoracic spine: Secondary | ICD-10-CM | POA: Diagnosis not present

## 2024-03-06 DIAGNOSIS — S61112A Laceration without foreign body of left thumb with damage to nail, initial encounter: Secondary | ICD-10-CM

## 2024-03-06 MED ORDER — METHOCARBAMOL 500 MG PO TABS: 500.0000 mg | ORAL_TABLET | Freq: Three times a day (TID) | ORAL | 0 refills | Status: AC | PRN

## 2024-03-06 NOTE — Progress Notes (Signed)
 "  Established Patient Office Visit  Subjective   Patient ID: Brian Lam, male    DOB: Oct 16, 1985  Age: 38 y.o. MRN: [id number]  Chief Complaint  Patient presents with   Finger Injury    HPI   Rolan is seen today for the following acute issues  Injury left thumb 6 days ago.  He was chopping onions with a sharp knife and accidentally cut his left thumb with avulsion type laceration.  He had significant bleeding for a day or 2 controlled with pressure.  Did lose part of the distal nail.  No bone exposure.  No signs of secondary infection.  Has been cleaning daily with soap and water  and using Vaseline topically and protective dressing.  No drainage.  Second issue is upper thoracic back pain.  He has sensation of muscle tightness and soreness.  Has been sensitive to sedating effects of muscle relaxers in the past.  No radiculitis symptoms.  Pain is somewhat bilateral  Past Medical History:  Diagnosis Date   Back pain    Chest pain    Chronic knee pain    Contact dermatitis    Depression    GERD (gastroesophageal reflux disease)    History of kidney stones    History of stomach ulcers    IBS (irritable bowel syndrome)    Migraines    OSA on CPAP    severe obstructive sleep apnea with an AHI 57.7/h and nocturnal hypoxemia for O2 saturations less than 88% for 22.6 minutes.   Pneumonia    Pre-diabetes    Shortness of breath    Past Surgical History:  Procedure Laterality Date   CYSTOSCOPY/URETEROSCOPY/HOLMIUM LASER/STENT PLACEMENT Right 01/10/2023   Procedure: CYSTOSCOPY, RIGHT RETROGRADE PYELOGRAM, RIGHT URETEROSCOPY, HOLMIUM LASER LITHOTRIPSY, AND RIGHT URETERAL STENT PLACEMENT;  Surgeon: Selma Donnice SAUNDERS, MD;  Location: [facility];  Service: Urology;  Laterality: Right;  60 MINUTES   VASECTOMY  2020    reports that he has never smoked. He has never used smokeless tobacco. He reports current alcohol use. He reports that he does not use drugs. family history includes Anxiety  disorder in his mother; Depression in his mother; Diabetes in his father and maternal grandfather; Diabetes type II in his father; Heart attack in his maternal grandfather; Hyperlipidemia in his father; Kidney disease in his father; Obesity in his father and mother; Sleep apnea in his father and mother; Thyroid  disease in his mother. Allergies[1]  Review of Systems  Constitutional:  Negative for chills and fever.  Musculoskeletal:  Positive for back pain. Negative for neck pain.      Objective:     BP 92/60   Pulse 99   Temp 98.5 F (36.9 C) (Oral)   Wt 297 lb 11.2 oz (135 kg)   SpO2 95%   BMI 40.38 kg/m  BP Readings from Last 3 Encounters:  03/06/24 92/60  12/29/23 109/70  11/21/23 99/64   Wt Readings from Last 3 Encounters:  03/06/24 297 lb 11.2 oz (135 kg)  12/29/23 280 lb (127 kg)  11/21/23 282 lb (127.9 kg)      Physical Exam Vitals reviewed.  Constitutional:      General: He is not in acute distress.    Appearance: He is not ill-appearing.  Cardiovascular:     Rate and Rhythm: Normal rate and regular rhythm.  Pulmonary:     Effort: Pulmonary effort is normal.     Breath sounds: Normal breath sounds.  Musculoskeletal:     Comments: No  spinal tenderness.  Left thumb reveals recent avulsion laceration.  No bone exposure.  No foreign bodies.  No erythema.  No drainage.  He lost the very distal aspect of the nail secondary to the laceration.  Neurological:     Mental Status: He is alert.      No results found for any visits on 03/06/24.    The 10-year ASCVD risk score (Arnett DK, et al., 2019) is: 0.5%    Assessment & Plan:   #1 avulsion type laceration left thumb.  No signs of secondary infection.  Thankfully, avulsion was fairly superficial and not to bone.  Some mild distal nail involvement.  Looks like this is doing well currently with good healing and no signs of secondary infection.  Continue to clean daily with soap and water  and continue topical  Vaseline and protective dressing for at least another week.  #2 upper thoracic back pain.  Suspect muscular.  Continue heat and stretches.  Cautious trial of Robaxin  500 mg every 8 hours as needed for muscle spasm.  Caution for potential for sedation and dizziness with use of medication.  Consider trial of physical therapy in couple weeks if not improved   No follow-ups on file.    Wolm Scarlet, MD     [1] No Known Allergies  "

## 2024-03-10 ENCOUNTER — Encounter: Payer: Self-pay | Admitting: Family Medicine

## 2024-03-25 ENCOUNTER — Encounter: Payer: Self-pay | Admitting: Family Medicine

## 2024-03-26 MED ORDER — OMEPRAZOLE 40 MG PO CPDR: 40.0000 mg | DELAYED_RELEASE_CAPSULE | Freq: Every day | ORAL | 1 refills | Status: AC

## 2024-03-28 ENCOUNTER — Encounter: Payer: Self-pay | Admitting: Family Medicine

## 2024-03-28 MED ORDER — TOPIRAMATE 50 MG PO TABS: 50.0000 mg | ORAL_TABLET | Freq: Two times a day (BID) | ORAL | 3 refills | Status: AC

## 2024-03-28 NOTE — Telephone Encounter (Signed)
 I sent in refills for one year.  Wolm LELON Scarlet MD [facility]

## 2024-04-02 ENCOUNTER — Encounter (INDEPENDENT_AMBULATORY_CARE_PROVIDER_SITE_OTHER): Payer: Self-pay | Admitting: Family Medicine

## 2024-04-02 ENCOUNTER — Ambulatory Visit (INDEPENDENT_AMBULATORY_CARE_PROVIDER_SITE_OTHER): Payer: Self-pay | Admitting: Family Medicine

## 2024-04-02 DIAGNOSIS — R7303 Prediabetes: Secondary | ICD-10-CM | POA: Diagnosis not present

## 2024-04-02 DIAGNOSIS — Z6841 Body Mass Index (BMI) 40.0 and over, adult: Secondary | ICD-10-CM | POA: Diagnosis not present

## 2024-04-02 DIAGNOSIS — E782 Mixed hyperlipidemia: Secondary | ICD-10-CM

## 2024-04-02 DIAGNOSIS — E559 Vitamin D deficiency, unspecified: Secondary | ICD-10-CM

## 2024-04-02 DIAGNOSIS — E538 Deficiency of other specified B group vitamins: Secondary | ICD-10-CM | POA: Diagnosis not present

## 2024-04-02 DIAGNOSIS — F339 Major depressive disorder, recurrent, unspecified: Secondary | ICD-10-CM

## 2024-04-02 MED ORDER — ERGOCALCIFEROL 1.25 MG (50000 UT) PO CAPS: 50000.0000 [IU] | ORAL_CAPSULE | ORAL | 0 refills | Status: AC

## 2024-04-02 NOTE — Progress Notes (Signed)
 "  Brian Lam, D.O.  ABFM, ABOM Specializing in Clinical Bariatric Medicine  Office located at: [address] [place], [place]  [address]       Orders Placed This Encounter  Procedures   Comprehensive metabolic panel with GFR   Vitamin B12   VITAMIN D  25 Hydroxy (Vit-D Deficiency, Fractures)   Hemoglobin A1c   Lipid panel   Insulin , random   Folate   TSH    Medications Discontinued During This Encounter  Medication Reason   ergocalciferol  (VITAMIN D2) 1.25 MG (50000 UT) capsule Reorder     Meds ordered this encounter  Medications   ergocalciferol  (VITAMIN D2) 1.25 MG (50000 UT) capsule    Sig: Take 1 capsule (50,000 Units total) by mouth once a week.    Dispense:  12 capsule    Refill:  0      A) FOR THE CHRONIC DISEASE OF OBESITY:  Morbid obesity -- starting bmi 44.88 BMI 40.0-44.9, adult (HCC) - Current BMI 40.14  Chief complaint: Obesity Brian Lam is here to discuss his progress with his obesity treatment plan.   History of present illness / Interval history:  Brian Lam is here today for his follow-up office visit.  Since last OV on 12/29/2023 with provider: Dr. Jenkins, patient is up 16 lbs.   Suboptimal adherence due to family/work stressors.   Pt has been struggling with:  []  meal planning and prepping [x]  exercise []  sleep [x]  Stressors- personal/familial stress []  mood []  chronic or acute medical conditions-  []  eating out more   Pt has been working on and improving their:  []  meal planning and prepping []  exercise []  Sleep []  water  intake [x]  strategies to better manage personal stressors- has started journaling []  strategies to better manage mood []  eating out less    When asked by CMA prior to our OV, pt states they have been:  - Focused on eating fresh, unprocessed foods?  Yes   - Focused on eating lean proteins with each meal?  Yes   - Sleeping 7-9 Hours/ Night?  Yes   - Skipping Meals?  No   - States he is following  his healthy eating plan approximately 0 % of the time.  Recent weight loss data history  12/29/23 15:00 04/02/24 13:00   Body Fat % 32.6 % 34 %  Muscle Mass (lbs) 179.6 lbs 186.2 lbs  Fat Mass (lbs) 91.4 lbs 101 lbs  Total Body Water  (lbs) 140 lbs 142 lbs  Visceral Fat Rating  17 19   Counseling done on how various foods/ behaviors will affect these numbers and how to maximize weight loss success discussed today in detail based on these findings.  Total lbs lost to date: -35 lbs Total Fat Mass in lbs lost to date: -29.6 lbs Total weight loss percentage to date since starting program:  -10.57 %   Physician directed Nutrition Therapy prescription: He is journaling 1900-2000 calories with 120++ g protein using Cat 4 MP as guide.  Nilson is currently in the action stage of change. As such, his goal is to continue and/ or modify their weight management treatment plan.  He has agreed to: continue current plan   Physician directed Behavioral Modification prescription: Evidence-based interventions for healthy behavior change were utilized today including the discussion of small changes and SMART goals.  barriers to successful adherence to behavorial change for wt loss:  implement self-care strategies  SPECIFIC SMART goals for next OV:  Start walking 3-4 times  a week  Regarding patient's less desirable eating habits and patterns, we employed the technique of small changes.  For next OV, pt will focus on smaller meals throughout the day.  We discussed the following today: increasing lean protein intake to established goals, work on tracking and journaling calories using tracking application, decreasing eating out or consumption of processed foods, and making healthy choices when eating convenient foods, and work on managing stress, creating time for self-care and relaxation  Additional resources provided today: Handout on CAT 4 meal plan, Handout on CAT 3-4 breakfast options, Handout on  CAT 3-4 lunch options, and Handout on Daily Food Journaling Log   Physician directed Physical Activity prescription: Pt is not exercising.  barriers to successful adherence to exercise for wt loss:  work and life stressors  Treysean has been advised increase exercise to help promote weight loss and manage stress.      He has agreed to : Think about enjoyable ways to increase daily physical activity and overcoming barriers to exercise, Increase physical activity in their day and reduce sedentary time (increase NEAT)., and Increase volume of physical activity to a goal of 240 minutes a week    Medical Interventions/ Pharmacotherapy Previous Bariatric surgery: none   Previously tried medical weight loss medications/ therapies:   GLP-1:  Semaglutide  (ozempic ) Was previously on Ozempic  for about a year. Discontinued medication due to nausea and regained the weight.   Pharmacotherapy for weight loss: He is currently taking Topiramate  50 mg twice daily  for medical weight loss.   Encouraged to ask insurance coverage for Zepbound  or Wegovy .   We discussed various medication options to help Lestat with his weight loss efforts and we both agreed to:  Was educated on available treatment options, including pharmacotherapy with emphasis on potential side effects and other available medical interventions and Nutritional guidance was provided to help minimize the risk of exacerbating GLP-1 related side effects    B) OBESITY RELATED CONDITIONS ADDRESSED TODAY:  RECHECK FASTING LABS  Pre-diabetes Assessment & Plan Lab Results  Component Value Date   HGBA1C 5.4 11/21/2023   HGBA1C 5.8 (H) 06/20/2023   HGBA1C 5.9 06/12/2021   INSULIN  11.6 11/21/2023   INSULIN  35.5 (H) 06/20/2023  Managed with dietary and lifestyle interventions. Reports increased hunger and cravings, most likely attributed to emotions and work and personal stress. Discussed starting Metformin to help control hunger and cravings.  Pt declines for now and wants to focus on adhering to the meal plan. Will revisit in the future. Encouraged to focus on high protein, low carbs foods. Cont following prudent nutritional meal plan. Increase exercise as able. Recheck A1c, insulin , CMP, TSH, and Folate    Depression, recurrent Assessment & Plan Pt is currently on Wellbutrin  XL started around October 1st 2025 and Prozac  daily with good compliance and tolerance. Reports the medication is helping his mood. Reports increased emotional eating. Mood is stable today. He has an individual therapist who he regularly sees. He also reports he was going to couples therapy with his wife, but their couples therapist has taken a hiatus and they have continued self-led therapy at home for about 3-4 hours.  Encouraged pt to implement self-care strategies and find time for himself outside of his relationship. He reports he has started journaling. Recommended pt continue therapy and adherence to medication. Extensive discussion on self-care strategies such as 4-7-8 breathing, self-help books, journaling.     Mixed hyperlipidemia Assessment & Plan Lab Results  Component Value Date  CHOL 141 11/21/2023   HDL 37 (L) 11/21/2023   LDLCALC 85 11/21/2023   TRIG 100 11/21/2023   CHOLHDL 3.8 11/21/2023  Managed by dietary and lifestyle interventions. Most recent lipid panel on 11/21/2023 shows LDL and TRIGS are WNL. HDL is at 37; optimal >65. Educated pt that this will be improved with increased exercise. Cont decreasing saturated/ trans fats. Increase exercise as able. Recheck FLP.    B12 deficiency Assessment & Plan - B12 level is 237, not at goal of over 500.  This diagnosis was reviewed with the patient and education was provided.  Lab Results  Component Value Date   VITAMINB12 237 11/21/2023  -Currently taking OTC B12 daily  - Continue their prudent nutritional plan and focus on b12 rich foods such as lean red meats; poultry; eggs; seafood;  beans, peas, and lentils; nuts and seeds; and soy products - Recheck B12.    Vitamin D  deficiency Assessment & Plan Lab Results  Component Value Date   VD25OH 62.6 11/21/2023   VD25OH 27.4 (L) 06/20/2023  Currently taking Ergo 50 K units once weekly with good compliance and tolerance. Cont regimen(refill today). Recheck today.     Medications Discontinued During This Encounter  Medication Reason   ergocalciferol  (VITAMIN D2) 1.25 MG (50000 UT) capsule Reorder     Meds ordered this encounter  Medications   ergocalciferol  (VITAMIN D2) 1.25 MG (50000 UT) capsule    Sig: Take 1 capsule (50,000 Units total) by mouth once a week.    Dispense:  12 capsule    Refill:  0        Follow up:   Return 05/09/2024 8:40 AM.  He was informed of the importance of frequent follow up visits to maximize his success with intensive lifestyle modifications for his multiple health conditions.   Labs obtained today and will be discussed/ addressed at their next office visit unless critically medical findings arise, then we will contact via Mychart   Weight Summary and Biometrics   Weight Lost Since Last Visit: 0  Weight Gained Since Last Visit: 16 lb    Vitals Temp: 97.9 F (36.6 C) BP: 134/73 Pulse Rate: 84 SpO2: 97 %   Anthropometric Measurements Height: 6' (1.829 m) Weight: 296 lb (134.3 kg) BMI (Calculated): 40.14 Weight at Last Visit: 280 lb Weight Lost Since Last Visit: 0 Weight Gained Since Last Visit: 16 lb Starting Weight: 331 lb Total Weight Loss (lbs): 35 lb (15.9 kg) Peak Weight: 335 lb   Body Composition  Body Fat %: 34 % Fat Mass (lbs): 101 lbs Muscle Mass (lbs): 186.2 lbs Total Body Water  (lbs): 142 lbs Visceral Fat Rating : 19   Other Clinical Data Fasting: No Labs: No Today's Visit #: 8 Starting Date: 06/20/23    Objective:   PHYSICAL EXAM: Blood pressure 134/73, pulse 84, temperature 97.9 F (36.6 C), height 6' (1.829 m), weight 296 lb  (134.3 kg), SpO2 97%. Body mass index is 40.14 kg/m. General: he is overweight, cooperative and in no acute distress. PSYCH: Has normal mood, affect and thought process.   HEENT: EOMI, sclerae are anicteric. Lungs: Normal breathing effort, no conversational dyspnea. Extremities: Moves * 4 Neurologic: A and O * 3, good insight  DIAGNOSTIC DATA REVIEWED: BMET    Component Value Date/Time   NA 142 11/21/2023 0759   K 4.4 11/21/2023 0759   CL 106 11/21/2023 0759   CO2 20 11/21/2023 0759   GLUCOSE 100 (H) 11/21/2023 0759   GLUCOSE 91 01/10/2023  1230   BUN 14 11/21/2023 0759   CREATININE 1.12 11/21/2023 0759   CALCIUM 9.1 11/21/2023 0759   GFRNONAA >60 01/10/2023 1230   Lab Results  Component Value Date   HGBA1C 5.4 11/21/2023   HGBA1C 5.9 06/12/2021   Lab Results  Component Value Date   INSULIN  11.6 11/21/2023   INSULIN  35.5 (H) 06/20/2023   Lab Results  Component Value Date   TSH 1.870 06/20/2023   CBC    Component Value Date/Time   WBC 6.1 06/20/2023 0901   WBC 8.4 11/26/2022 0648   RBC 4.80 06/20/2023 0901   RBC 4.77 11/26/2022 0648   HGB 13.7 06/20/2023 0901   HCT 41.4 06/20/2023 0901   PLT 202 06/20/2023 0901   MCV 86 06/20/2023 0901   MCH 28.5 06/20/2023 0901   MCH 28.5 11/26/2022 0648   MCHC 33.1 06/20/2023 0901   MCHC 34.3 11/26/2022 0648   RDW 13.4 06/20/2023 0901   Iron Studies No results found for: IRON, TIBC, FERRITIN, IRONPCTSAT Lipid Panel     Component Value Date/Time   CHOL 141 11/21/2023 0759   TRIG 100 11/21/2023 0759   HDL 37 (L) 11/21/2023 0759   CHOLHDL 3.8 11/21/2023 0759   CHOLHDL 5 06/12/2021 0923   VLDL 34.0 06/12/2021 0923   LDLCALC 85 11/21/2023 0759   Hepatic Function Panel     Component Value Date/Time   PROT 6.7 11/21/2023 0759   ALBUMIN 4.6 11/21/2023 0759   AST 17 11/21/2023 0759   ALT 18 11/21/2023 0759   ALKPHOS 56 11/21/2023 0759   BILITOT 0.7 11/21/2023 0759   BILIDIR 0.1 11/26/2022 0648   IBILI 0.4  11/26/2022 0648      Component Value Date/Time   TSH 1.870 06/20/2023 0901   Nutritional Lab Results  Component Value Date   VD25OH 62.6 11/21/2023   VD25OH 27.4 (L) 06/20/2023    Attestations:   LILLETTE Feliciano Mingle, acting as a stage manager for Brian Jenkins, DO., have compiled all relevant documentation for today's office visit on behalf of Brian Jenkins, DO, while in the presence of [employer], DO.  I have spent 47 minutes in the care of the patient today including 37 minutes face-to-face assessing and reviewing listed medical problems above as outlined in office visit note and providing nutritional and behavioral counseling as outlined in obesity care plan.   I have reviewed the above documentation for accuracy and completeness, and I agree with the above. Brian JINNY Lam, D.O.  The 21st Century Cures Act was signed into law in 2016 which includes the topic of electronic health records.  This provides immediate access to information in MyChart.  This includes consultation notes, operative notes, office notes, lab results and pathology reports.  If you have any questions about what you read please let us  know at your next visit so we can discuss your concerns and take corrective action if need be.  We are right here with you.  "

## 2024-04-12 ENCOUNTER — Other Ambulatory Visit: Payer: Self-pay | Admitting: Family Medicine

## 2024-05-09 ENCOUNTER — Ambulatory Visit (INDEPENDENT_AMBULATORY_CARE_PROVIDER_SITE_OTHER): Admitting: Family Medicine
